# Patient Record
Sex: Male | Born: 1960 | Race: White | Hispanic: No | Marital: Married | Smoking: Former smoker
Health system: Southern US, Community
[De-identification: ages and names within clinical notes are randomized; demographics above are authoritative.]

## PROBLEM LIST (undated history)

## (undated) DIAGNOSIS — N400 Enlarged prostate without lower urinary tract symptoms: Secondary | ICD-10-CM

## (undated) DIAGNOSIS — Z973 Presence of spectacles and contact lenses: Secondary | ICD-10-CM

## (undated) DIAGNOSIS — K5730 Diverticulosis of large intestine without perforation or abscess without bleeding: Secondary | ICD-10-CM

## (undated) DIAGNOSIS — Z860100 Personal history of colon polyps, unspecified: Secondary | ICD-10-CM

## (undated) DIAGNOSIS — K4090 Unilateral inguinal hernia, without obstruction or gangrene, not specified as recurrent: Secondary | ICD-10-CM

## (undated) DIAGNOSIS — R238 Other skin changes: Secondary | ICD-10-CM

## (undated) DIAGNOSIS — R030 Elevated blood-pressure reading, without diagnosis of hypertension: Secondary | ICD-10-CM

## (undated) DIAGNOSIS — G4700 Insomnia, unspecified: Secondary | ICD-10-CM

## (undated) DIAGNOSIS — R399 Unspecified symptoms and signs involving the genitourinary system: Secondary | ICD-10-CM

## (undated) DIAGNOSIS — K8020 Calculus of gallbladder without cholecystitis without obstruction: Secondary | ICD-10-CM

## (undated) DIAGNOSIS — C679 Malignant neoplasm of bladder, unspecified: Secondary | ICD-10-CM

## (undated) HISTORY — PX: HERNIA REPAIR: SHX51

## (undated) HISTORY — PX: TONSILLECTOMY: SUR1361

## (undated) HISTORY — DX: Insomnia, unspecified: G47.00

---

## 2004-08-31 ENCOUNTER — Ambulatory Visit: Payer: Self-pay | Admitting: Internal Medicine

## 2004-09-03 ENCOUNTER — Ambulatory Visit: Payer: Self-pay | Admitting: Internal Medicine

## 2004-09-15 ENCOUNTER — Ambulatory Visit: Payer: Self-pay | Admitting: Internal Medicine

## 2005-04-19 ENCOUNTER — Ambulatory Visit: Payer: Self-pay | Admitting: Internal Medicine

## 2005-10-28 ENCOUNTER — Ambulatory Visit: Payer: Self-pay | Admitting: Internal Medicine

## 2005-10-28 ENCOUNTER — Ambulatory Visit (HOSPITAL_COMMUNITY): Admission: RE | Admit: 2005-10-28 | Discharge: 2005-10-28 | Payer: Self-pay | Admitting: Internal Medicine

## 2005-11-10 ENCOUNTER — Encounter: Payer: Self-pay | Admitting: Internal Medicine

## 2007-05-18 ENCOUNTER — Ambulatory Visit: Payer: Self-pay | Admitting: Internal Medicine

## 2008-02-14 ENCOUNTER — Ambulatory Visit: Payer: Self-pay | Admitting: Internal Medicine

## 2008-02-14 DIAGNOSIS — M5412 Radiculopathy, cervical region: Secondary | ICD-10-CM | POA: Insufficient documentation

## 2008-02-14 DIAGNOSIS — G4700 Insomnia, unspecified: Secondary | ICD-10-CM

## 2008-07-07 ENCOUNTER — Telehealth (INDEPENDENT_AMBULATORY_CARE_PROVIDER_SITE_OTHER): Payer: Self-pay | Admitting: *Deleted

## 2008-07-15 ENCOUNTER — Ambulatory Visit: Payer: Self-pay | Admitting: Internal Medicine

## 2008-07-15 DIAGNOSIS — Z860100 Personal history of colon polyps, unspecified: Secondary | ICD-10-CM | POA: Insufficient documentation

## 2008-07-15 DIAGNOSIS — R05 Cough: Secondary | ICD-10-CM

## 2008-07-21 ENCOUNTER — Ambulatory Visit: Payer: Self-pay | Admitting: Internal Medicine

## 2008-07-21 LAB — CONVERTED CEMR LAB
Bilirubin Urine: NEGATIVE
Blood in Urine, dipstick: NEGATIVE
Glucose, Urine, Semiquant: NEGATIVE
Ketones, urine, test strip: NEGATIVE
Nitrite: NEGATIVE
Protein, U semiquant: NEGATIVE
Specific Gravity, Urine: 1.025
Urobilinogen, UA: 0.2
WBC Urine, dipstick: NEGATIVE
pH: 5

## 2008-07-28 LAB — CONVERTED CEMR LAB
ALT: 24 units/L (ref 0–53)
AST: 19 units/L (ref 0–37)
Basophils Absolute: 0.1 10*3/uL (ref 0.0–0.1)
Basophils Relative: 1.3 % (ref 0.0–3.0)
Bilirubin, Direct: 0.1 mg/dL (ref 0.0–0.3)
CO2: 31 meq/L (ref 19–32)
Calcium: 9.2 mg/dL (ref 8.4–10.5)
Chloride: 107 meq/L (ref 96–112)
Creatinine, Ser: 0.9 mg/dL (ref 0.4–1.5)
Glucose, Bld: 109 mg/dL — ABNORMAL HIGH (ref 70–99)
Hemoglobin: 14.8 g/dL (ref 13.0–17.0)
LDL Cholesterol: 112 mg/dL — ABNORMAL HIGH (ref 0–99)
Lymphocytes Relative: 41.1 % (ref 12.0–46.0)
MCHC: 34.4 g/dL (ref 30.0–36.0)
Monocytes Relative: 8.5 % (ref 3.0–12.0)
Neutro Abs: 2.4 10*3/uL (ref 1.4–7.7)
Neutrophils Relative %: 44.1 % (ref 43.0–77.0)
RBC: 4.55 M/uL (ref 4.22–5.81)
Sodium: 145 meq/L (ref 135–145)
Total Bilirubin: 0.6 mg/dL (ref 0.3–1.2)
Total CHOL/HDL Ratio: 5.1
Total Protein: 7.1 g/dL (ref 6.0–8.3)
VLDL: 15 mg/dL (ref 0–40)

## 2008-11-25 ENCOUNTER — Telehealth: Payer: Self-pay | Admitting: Internal Medicine

## 2009-04-08 ENCOUNTER — Telehealth: Payer: Self-pay | Admitting: Family Medicine

## 2009-04-08 ENCOUNTER — Ambulatory Visit: Payer: Self-pay | Admitting: Family Medicine

## 2009-04-08 LAB — CONVERTED CEMR LAB
Basophils Absolute: 0 10*3/uL (ref 0.0–0.1)
Eosinophils Absolute: 0.1 10*3/uL (ref 0.0–0.7)
HCT: 42.6 % (ref 39.0–52.0)
Hemoglobin: 15.2 g/dL (ref 13.0–17.0)
Lymphs Abs: 1.8 10*3/uL (ref 0.7–4.0)
MCHC: 35.5 g/dL (ref 30.0–36.0)
Monocytes Absolute: 0.7 10*3/uL (ref 0.1–1.0)
Neutro Abs: 7.5 10*3/uL (ref 1.4–7.7)
Platelets: 194 10*3/uL (ref 150.0–400.0)
RDW: 12.5 % (ref 11.5–14.6)

## 2009-08-10 ENCOUNTER — Encounter (INDEPENDENT_AMBULATORY_CARE_PROVIDER_SITE_OTHER): Payer: Self-pay | Admitting: *Deleted

## 2009-11-16 ENCOUNTER — Telehealth: Payer: Self-pay | Admitting: *Deleted

## 2010-03-05 ENCOUNTER — Telehealth (INDEPENDENT_AMBULATORY_CARE_PROVIDER_SITE_OTHER): Payer: Self-pay | Admitting: *Deleted

## 2010-05-07 ENCOUNTER — Telehealth: Payer: Self-pay | Admitting: *Deleted

## 2010-07-28 ENCOUNTER — Ambulatory Visit: Payer: Self-pay | Admitting: Internal Medicine

## 2010-07-28 LAB — CONVERTED CEMR LAB
AST: 17 units/L (ref 0–37)
Albumin: 4.2 g/dL (ref 3.5–5.2)
Alkaline Phosphatase: 72 units/L (ref 39–117)
Basophils Relative: 0.3 % (ref 0.0–3.0)
CO2: 29 meq/L (ref 19–32)
Calcium: 9.5 mg/dL (ref 8.4–10.5)
Eosinophils Absolute: 0.2 10*3/uL (ref 0.0–0.7)
Glucose, Urine, Semiquant: NEGATIVE
HCT: 41.8 % (ref 39.0–52.0)
HDL: 35.3 mg/dL — ABNORMAL LOW (ref >39.00)
Hemoglobin: 14.5 g/dL (ref 13.0–17.0)
Lymphocytes Relative: 15.6 % (ref 12.0–46.0)
MCHC: 34.7 g/dL (ref 30.0–36.0)
Monocytes Relative: 7.8 % (ref 3.0–12.0)
Neutro Abs: 7.8 10*3/uL — ABNORMAL HIGH (ref 1.4–7.7)
Nitrite: NEGATIVE
Potassium: 4.6 meq/L (ref 3.5–5.1)
RBC: 4.42 M/uL (ref 4.22–5.81)
Sodium: 143 meq/L (ref 135–145)
Specific Gravity, Urine: >=1.03
Total CHOL/HDL Ratio: 4
Total Protein: 7.5 g/dL (ref 6.0–8.3)
WBC Urine, dipstick: NEGATIVE

## 2010-08-04 ENCOUNTER — Ambulatory Visit: Payer: Self-pay | Admitting: Internal Medicine

## 2010-08-04 DIAGNOSIS — K824 Cholesterolosis of gallbladder: Secondary | ICD-10-CM | POA: Insufficient documentation

## 2010-08-04 LAB — CONVERTED CEMR LAB
Protein, U semiquant: NEGATIVE
Urobilinogen, UA: 0.2
WBC Urine, dipstick: NEGATIVE

## 2010-09-29 ENCOUNTER — Telehealth: Payer: Self-pay | Admitting: *Deleted

## 2010-11-23 NOTE — Assessment & Plan Note (Signed)
Summary: CPX   Vital Signs:  Patient profile:   50 year old male Height:      72.5 inches Weight:      191 pounds BMI:     25.64 Pulse rate:   72 / minute BP sitting:   110 / 80  (right arm) Cuff size:   regular  Vitals Entered By: Romualdo Bolk, CMA (AAMA) (August 04, 2010 10:41 AM) CC: CPX   History of Present Illness: [name]    comes in today   for check up.   Since last visit  here  there have been no major changes in health status  . His back bothers him a bit at times but   no limiting problems now. No CV  sx sob.  Marland Kitchen  Has some sleep issues and asks for reill of meds to use as  needed.  Has had a uri type illness recently and still an am cough . asks about  chest x ray as ex smoker and fam hx of lung cancer .      Preventive Care Screening  Colonoscopy:    Date:  10/25/2003    Results:  normal   Prior Values:    PSA:  1.70 (07/28/2010)   Preventive Screening-Counseling & Management  Alcohol-Tobacco     Alcohol drinks/day: 3     Alcohol type: wine     Smoking Status: quit  Caffeine-Diet-Exercise     Caffeine use/day: 1-2 cups a day     Does Patient Exercise: yes  Hep-HIV-STD-Contraception     Dental Visit-last 6 months yes     Sun Exposure-Excessive: no  Safety-Violence-Falls     Seat Belt Use: yes     Firearms in the Home: no firearms in the home     Smoke Detectors: yes      Blood Transfusions:  no.    Current Medications (verified): 1)  Ambien 10 Mg Tabs (Zolpidem Tartrate) .... Take 1 Tablet By Mouth At Bedtime 2)  Cyclobenzaprine Hcl 10 Mg  Tabs (Cyclobenzaprine Hcl) .Marland Kitchen.. 1 By Mouth Three Times A Day As Needed Muscle Spasm and Pain 3)  Co-Gesic 5-500 Mg  Tabs (Hydrocodone-Acetaminophen) .Marland Kitchen.. 1-2   By Mouth Q 4-6 Hours   As Needed  For Pain  Allergies (verified): 1)  ! Penicillin  Past History:  Past medical, surgical, family and social histories (including risk factors) reviewed, and no changes noted (except as noted  below).  Past Medical History: Reviewed history from 07/15/2008 and no changes required. see old chart  insomnia intermittent cholesterosis of gallbladder  Colonic polyps, hx of  Past Surgical History: Reviewed history from 07/15/2008 and no changes required. Inguinal herniorrhaphy right Tonsillectomy    Past History:  Care Management: Gastroenterology: Leone Payor  Family History: Reviewed history from 07/15/2008 and no changes required. parent had lung cancer.  moms colon cancer   fathers side see  paper   Social History: Reviewed history from 07/15/2008 and no changes required. Married neborn at home  2 twin  boys and wife  Former Smoker quit about 9 years ago  1ppd x  some milk   hhof 5 Caffeine use/day:  1-2 cups a day Does Patient Exercise:  yes Seat Belt Use:  yes Dental Care w/in 6 mos.:  yes Sun Exposure-Excessive:  no Blood Transfusions:  no  Review of Systems  The patient denies anorexia, fever, weight loss, weight gain, vision loss, decreased hearing, chest pain, syncope, dyspnea on exertion, peripheral edema, headaches, hemoptysis,  abdominal pain, melena, hematochezia, severe indigestion/heartburn, hematuria, incontinence, muscle weakness, suspicious skin lesions, transient blindness, difficulty walking, depression, unusual weight change, abnormal bleeding, enlarged lymph nodes, angioedema, and testicular masses.         ocass back issue  varicose veins   cough for chest cold phlegm in am   but no cp sob or hemoptysis   ruq pain ocassinally   Physical Exam  General:  Well-developed,well-nourished,in no acute distress; alert,appropriate and cooperative throughout examination Head:  normocephalic and atraumatic.   Eyes:  PERRL, EOMs full, conjunctiva clear  Ears:  R ear normal, L ear normal, and no external deformities.   Nose:  no external deformity, no external erythema, and no nasal discharge.   Mouth:  good dentition and pharynx pink and moist.   Neck:   No deformities, masses, or tenderness noted. Chest Wall:  No deformities, masses, tenderness or gynecomastia noted. Lungs:  Normal respiratory effort, chest expands symmetrically. Lungs are clear to auscultation, no crackles or wheezes.no dullness.   Heart:  Normal rate and regular rhythm. S1 and S2 normal without gallop, murmur, click, rub or other extra sounds.no lifts.   Abdomen:  Bowel sounds positive,abdomen soft and non-tender without masses, organomegaly or hernias noted. Rectal:  No external abnormalities noted. Normal sphincter tone. No rectal masses or tenderness. Prostate:  Prostate gland firm and smooth, no enlargement, nodularity, tenderness, mass, asymmetry or induration. Msk:  normal ROM, no joint tenderness, no joint swelling, no joint warmth, no redness over joints, and no joint deformities.   Pulses:  pulses intact without delay   Extremities:  varicose veins no ulcer s   or dema  Neurologic:  alert & oriented X3, cranial nerves II-XII intact, strength normal in all extremities, gait normal, and DTRs symmetrical and normal.   Skin:  turgor normal, color normal, no ecchymoses, no petechiae, and no ulcerations.   Cervical Nodes:  No lymphadenopathy noted Axillary Nodes:  No palpable lymphadenopathy Inguinal Nodes:  No significant adenopathy Psych:  Normal eye contact, appropriate affect. Cognition appears normal.  see labs   Impression & Recommendations:  Problem # 1:  Preventive Health Care (ICD-V70.0) Discussed nutrition,exercise,diet,healthy weight, vitamin D and calcium.     HDL somewhat low  ratio is 4  Reviewed preventive care protocols, scheduled due services, and updated immunizations.  Problem # 2:  COUGH (ICD-786.2) seems uri but with fam hx   lund cancdr and ext tobacco ok to do x ray  Orders: T-2 View CXR (71020TC)  Problem # 3:  INSOMNIA-SLEEP DISORDER-UNSPEC (ICD-780.52)  His updated medication list for this problem includes:    Ambien 10 Mg Tabs  (Zolpidem tartrate) .Marland Kitchen... Take 1 tablet by mouth at bedtime  Discussed sleep hygiene.   Problem # 4:  CHOLESTEROLOSIS OF THE GALLBLADDER (ICD-575.6) only ocass signs  .       chosing to not  have removal.  Complete Medication List: 1)  Ambien 10 Mg Tabs (Zolpidem tartrate) .... Take 1 tablet by mouth at bedtime 2)  Cyclobenzaprine Hcl 10 Mg Tabs (Cyclobenzaprine hcl) .Marland Kitchen.. 1 by mouth three times a day as needed muscle spasm and pain 3)  Co-gesic 5-500 Mg Tabs (Hydrocodone-acetaminophen) .Marland Kitchen.. 1-2   by mouth q 4-6 hours   as needed  for pain  Other Orders: Admin 1st Vaccine (16109) Flu Vaccine 39yrs + (60454) Tdap => 49yrs IM (09811) Admin of Any Addtl Vaccine (91478)  Patient Instructions: 1)  get your colonscopy.  2)  will let you know about  chest x ray result. 3)  mediterranean diet  limit animal fats and sinple carbohydrates and sugars . 4)  exercise  . 5)  check up in a year or as needed. Flu Vaccine Consent Questions     Do you have a history of severe allergic reactions to this vaccine? no    Any prior history of allergic reactions to egg and/or gelatin? no    Do you have a sensitivity to the preservative Thimersol? no    Do you have a past history of Guillan-Barre Syndrome? no    Do you currently have an acute febrile illness? no    Have you ever had a severe reaction to latex? no    Vaccine information given and explained to patient? yes    Are you currently pregnant? no    Lot Number:[id number]   Exp Date:04/23/2011   Site Given  Left Deltoid IM Romualdo Bolk, CMA Duncan Dull)  August 04, 2010 10:47 AM  Prescriptions: AMBIEN 10 MG TABS (ZOLPIDEM TARTRATE) Take 1 tablet by mouth at bedtime  #30 x 1   Entered and Authorized by:   Madelin Headings MD   Signed by:   Madelin Headings MD on 08/04/2010   Method used:   Print then Give to Patient   RxID:   [id number]     .[email or web address]  Laboratory Results   Urine Tests  Date/Time Received: August 04, 2010 10:58  AM   Routine Urinalysis   Color: yellow Appearance: Clear Glucose: negative   (Normal Range: Negative) Bilirubin: negative   (Normal Range: Negative) Ketone: negative   (Normal Range: Negative) Spec. Gravity: 1.015   (Normal Range: 1.003-1.035) Blood: negative   (Normal Range: Negative) pH: 7.0   (Normal Range: 5.0-8.0) Protein: negative   (Normal Range: Negative) Urobilinogen: 0.2   (Normal Range: 0-1) Nitrite: negative   (Normal Range: Negative) Leukocyte Esterace: negative   (Normal Range: Negative)    Comments: Romualdo Bolk, CMA (AAMA)  August 04, 2010 10:59 AM       Immunizations Administered:  Tetanus Vaccine:    Vaccine Type: Tdap    Site: right deltoid    Mfr: GlaxoSmithKline    Dose: 0.5 ml    Route: IM    Given by: Romualdo Bolk, CMA (AAMA)    Exp. Date: 08/12/2012    Lot #: [id number]

## 2010-11-23 NOTE — Progress Notes (Signed)
Summary: refill on ambien   Phone Note Call from Patient Call back at [facility] Phone [phone]   Caller: Patient Summary of Call: Pt needs a refill on ambien called into [facility]. Initial call taken by: Romualdo Bolk, CMA Duncan Dull),  November 16, 2009 12:54 PM  Follow-up for Phone Call        ok to refill x 2  .  needs  OV  before  runs out of refills Follow-up by: Madelin Headings MD,  November 16, 2009 1:23 PM  Additional Follow-up for Phone Call Additional follow up Details #1::        Left message for pt to call back to schedule a follow up appt before refills run out. Rx called in. Additional Follow-up by: Romualdo Bolk, CMA Duncan Dull),  November 16, 2009 3:57 PM    Prescriptions: AMBIEN 10 MG TABS (ZOLPIDEM TARTRATE) Take 1 tablet by mouth at bedtime  #30 x 1   Entered by:   Romualdo Bolk, CMA (AAMA)   Authorized by:   Madelin Headings MD   Signed by:   Romualdo Bolk, CMA (AAMA) on 11/16/2009   Method used:   Telephoned to ...       [facility]* (retail)       [address]       [place], [place]  [id number]       Ph: [phone]       Fax: [phone]   RxID:   [id number]

## 2010-11-23 NOTE — Progress Notes (Signed)
Summary: refill on ambien  Phone Note Call from Patient Call back at [phone]   Caller: Patient Summary of Call: Pt needs a rx on ambien. Pt is going out of town this afternoon. Pt is aware that he needs a rov and will call back to reschedule it once he gets back into town. Initial call taken by: Romualdo Bolk, CMA (AAMA),  May 07, 2010 8:28 AM  Follow-up for Phone Call        ok disp   30  1 by mouth hs as needed sleep  Follow-up by: Madelin Headings MD,  May 07, 2010 12:29 PM  Additional Follow-up for Phone Call Additional follow up Details #1::        Rx called into [facility]. Left message on machine about this.  Additional Follow-up by: Romualdo Bolk, CMA (AAMA),  May 07, 2010 2:15 PM    New/Updated Medications: AMBIEN 10 MG TABS (ZOLPIDEM TARTRATE) Take 1 tablet by mouth at bedtime Prescriptions: AMBIEN 10 MG TABS (ZOLPIDEM TARTRATE) Take 1 tablet by mouth at bedtime  #30 x 0   Entered by:   Romualdo Bolk, CMA (AAMA)   Authorized by:   Madelin Headings MD   Signed by:   Romualdo Bolk, CMA (AAMA) on 05/07/2010   Method used:   Telephoned to ...       [facility]* (retail)       [address]       [place], [place]  [id number]       Ph: [phone]       Fax: [phone]   RxID:   [id number]

## 2010-11-23 NOTE — Progress Notes (Signed)
Summary: refill on ambien  Phone Note Call from Patient   Caller: Patient Summary of Call: Pt needs a refill on his ambien. Initial call taken by: Romualdo Bolk, CMA (AAMA),  September 29, 2010 3:23 PM  Follow-up for Phone Call        ok to refill x 3 Follow-up by: Madelin Headings MD,  September 29, 2010 4:32 PM  Additional Follow-up for Phone Call Additional follow up Details #1::        Rx called in. Pt aware. Additional Follow-up by: Romualdo Bolk, CMA (AAMA),  September 29, 2010 4:47 PM    Prescriptions: AMBIEN 10 MG TABS (ZOLPIDEM TARTRATE) Take 1 tablet by mouth at bedtime  #30 x 2   Entered by:   Romualdo Bolk, CMA (AAMA)   Authorized by:   Madelin Headings MD   Signed by:   Romualdo Bolk, CMA (AAMA) on 09/29/2010   Method used:   Telephoned to ...       [facility]* (retail)       [address]       [place], [place]  [id number]       Ph: [phone]       Fax: [phone]   RxID:   [id number]

## 2010-11-23 NOTE — Progress Notes (Signed)
Summary: Schedule recall colonoscopy   Phone Note Outgoing Call Call back at # [phone]   Call placed by: Christie Nottingham CMA Duncan Dull),  Mar 05, 2010 10:47 AM Call placed to: Patient Summary of Call: Called pt to schedule recall colonoscopy and phone number in chart has been disconnected and there is no alternate numbers.  Initial call taken by: Christie Nottingham CMA Duncan Dull),  Mar 05, 2010 10:48 AM

## 2010-12-14 ENCOUNTER — Other Ambulatory Visit: Payer: Self-pay | Admitting: *Deleted

## 2010-12-14 NOTE — Telephone Encounter (Signed)
Ok to refill x 3 

## 2010-12-14 NOTE — Telephone Encounter (Signed)
Last written on 09/29/10 #30 with 2 refills. Pt states that he has about 1/2 month left but was calling for son's rx and went ahead to let us know that he needs refills as well.

## 2010-12-15 MED ORDER — ZOLPIDEM TARTRATE 10 MG PO TABS: 10.0000 mg | ORAL_TABLET | Freq: Every evening | ORAL | Status: DC | PRN

## 2010-12-15 NOTE — Telephone Encounter (Signed)
Rx called in 

## 2011-04-08 ENCOUNTER — Other Ambulatory Visit: Payer: Self-pay | Admitting: Internal Medicine

## 2011-04-08 NOTE — Telephone Encounter (Signed)
rx sent by fax

## 2011-06-09 ENCOUNTER — Telehealth: Payer: Self-pay | Admitting: *Deleted

## 2011-06-09 NOTE — Telephone Encounter (Signed)
Pt needs a refill ambien and flexeril called into [facility].

## 2011-06-09 NOTE — Telephone Encounter (Signed)
Ok x 1 each 

## 2011-06-13 MED ORDER — CYCLOBENZAPRINE HCL 10 MG PO TABS: 10.0000 mg | ORAL_TABLET | Freq: Three times a day (TID) | ORAL | Status: DC | PRN

## 2011-06-13 MED ORDER — ZOLPIDEM TARTRATE 10 MG PO TABS: 10.0000 mg | ORAL_TABLET | Freq: Every evening | ORAL | Status: DC | PRN

## 2011-06-13 NOTE — Telephone Encounter (Signed)
Rx called in 

## 2011-07-11 ENCOUNTER — Encounter: Payer: Self-pay | Admitting: Internal Medicine

## 2011-07-11 ENCOUNTER — Ambulatory Visit (INDEPENDENT_AMBULATORY_CARE_PROVIDER_SITE_OTHER): Payer: 59 | Admitting: Internal Medicine

## 2011-07-11 VITALS — BP 140/80 | HR 72 | Temp 98.9°F | Resp 16 | Ht 73.0 in | Wt 198.0 lb

## 2011-07-11 DIAGNOSIS — D126 Benign neoplasm of colon, unspecified: Secondary | ICD-10-CM

## 2011-07-11 DIAGNOSIS — Z23 Encounter for immunization: Secondary | ICD-10-CM

## 2011-07-11 DIAGNOSIS — K635 Polyp of colon: Secondary | ICD-10-CM

## 2011-07-11 DIAGNOSIS — L089 Local infection of the skin and subcutaneous tissue, unspecified: Secondary | ICD-10-CM

## 2011-07-11 DIAGNOSIS — B958 Unspecified staphylococcus as the cause of diseases classified elsewhere: Secondary | ICD-10-CM

## 2011-07-11 DIAGNOSIS — G4700 Insomnia, unspecified: Secondary | ICD-10-CM

## 2011-07-11 MED ORDER — DOXYCYCLINE HYCLATE 100 MG PO TABS: 100.0000 mg | ORAL_TABLET | Freq: Two times a day (BID) | ORAL | Status: AC

## 2011-07-11 MED ORDER — ZOLPIDEM TARTRATE 10 MG PO TABS: 10.0000 mg | ORAL_TABLET | Freq: Every evening | ORAL | Status: DC | PRN

## 2011-07-11 NOTE — Progress Notes (Signed)
  Subjective:    Patient ID: [name], male    DOB: 1960/11/17, 50 y.o.   MRN: [id number]  HPI comes in today for an acute visit evaluation. 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 Now he has some spots on his right arm that itch and hurt. It hasn't drained.  One of his children and his wife have had a history of staph infections that are now resolved from the skin. He has no other history of infections or MRSA  Also asks about refill of Ambien. He is using it more regularly but only probably a half a pill just in case. Asks advice in for refill. Review of Systems No associated fever chest pain shortness of breath joint swelling.  Past history family history social history reviewed in the electronic medical record.     Objective:   Physical Exam Well-developed well-nourished in no acute distress affect is normal Neck: Supple without adenopathy or masses or bruits No clubbing cyanosis or edema SKIN: Right forearm shows 3 feeding papular excoriations. These are discrete there is a 2 cm raised red boil indurated nondraining. It is mildly tender and he states it itches  there is a 1 cm boil with a center pustule on the right lateral arm  this was unroofed with a needle and cultured. Minimal discharge  No arm adenopathy  Or streaking  No clubbing cyanosis or edema  PsychOriented x 3. Normal cognition, attention, speech. Not anxious or depressed appearing   Good eye contact .     Assessment & Plan:  Boils probable staph infection by history the larger one may need an I&D if it doesn't continue to get smaller  Culture  and empiric antibiotics local care warm compresses not cold ;close followup.  Sleep disturbance intermittent insomnia. No obvious underlying disease. He is a father of 3.   Risk benefit of medication discussed.  Including dependency and sleep problems with withdrawal. At this point in time we will refill his medication he is fully aware of above.

## 2011-07-11 NOTE — Patient Instructions (Addendum)
This acts like a staph infection . You could have infected bug bites. Warm compresses and topical antibiotic if drains. Will let you know  When culture is back. If getting larger or more tender call  . Sometimes needs to be drained to get bettter.Marland Kitchen

## 2011-07-14 LAB — WOUND CULTURE
Gram Stain: NONE SEEN
Gram Stain: NONE SEEN
Gram Stain: NONE SEEN

## 2011-07-14 NOTE — Progress Notes (Signed)
Left message on machine about results. 

## 2011-07-15 DIAGNOSIS — Z23 Encounter for immunization: Secondary | ICD-10-CM

## 2011-08-03 ENCOUNTER — Other Ambulatory Visit: Payer: Self-pay | Admitting: Internal Medicine

## 2011-08-03 NOTE — Telephone Encounter (Signed)
LOV 07/11/11 NOV- None

## 2011-08-05 ENCOUNTER — Telehealth: Payer: Self-pay | Admitting: *Deleted

## 2011-08-05 MED ORDER — ZOLPIDEM TARTRATE 10 MG PO TABS: 10.0000 mg | ORAL_TABLET | Freq: Every evening | ORAL | Status: DC | PRN

## 2011-08-05 NOTE — Telephone Encounter (Signed)
Refill on zolpidem 10mg  Last filled on 07/11/11

## 2011-08-05 NOTE — Telephone Encounter (Signed)
Addended by: Romualdo Bolk on: 08/05/2011 03:05 PM   Modules accepted: Orders

## 2011-08-05 NOTE — Telephone Encounter (Signed)
Per Dr. Fabian Sharp- Ok x 2

## 2011-09-28 ENCOUNTER — Other Ambulatory Visit: Payer: Self-pay | Admitting: Internal Medicine

## 2011-10-25 DIAGNOSIS — C679 Malignant neoplasm of bladder, unspecified: Secondary | ICD-10-CM

## 2011-10-25 HISTORY — DX: Malignant neoplasm of bladder, unspecified: C67.9

## 2011-10-28 ENCOUNTER — Ambulatory Visit (INDEPENDENT_AMBULATORY_CARE_PROVIDER_SITE_OTHER): Payer: 59 | Admitting: Internal Medicine

## 2011-10-28 ENCOUNTER — Encounter: Payer: Self-pay | Admitting: Internal Medicine

## 2011-10-28 VITALS — BP 120/80 | HR 60 | Ht 72.5 in | Wt 203.0 lb

## 2011-10-28 DIAGNOSIS — E786 Lipoprotein deficiency: Secondary | ICD-10-CM

## 2011-10-28 DIAGNOSIS — G4700 Insomnia, unspecified: Secondary | ICD-10-CM

## 2011-10-28 DIAGNOSIS — K824 Cholesterolosis of gallbladder: Secondary | ICD-10-CM

## 2011-10-28 DIAGNOSIS — R7309 Other abnormal glucose: Secondary | ICD-10-CM

## 2011-10-28 DIAGNOSIS — M545 Low back pain, unspecified: Secondary | ICD-10-CM

## 2011-10-28 DIAGNOSIS — R739 Hyperglycemia, unspecified: Secondary | ICD-10-CM

## 2011-10-28 NOTE — Patient Instructions (Signed)
Continue lifestyle intervention healthy eating and exercise . Plan for  Fasting  Labs to include lipids and blood sugar . Make appt for this .

## 2011-10-28 NOTE — Progress Notes (Signed)
Subjective:    Patient ID: [name], male    DOB: 11-25-1960, 51 y.o.   MRN: [id number]  HPI Comes in today for fu of med/ insomnia issue.  Has been taking  ambien pretty much every noght and does well without se . Aware of  Habit forming  Issue . No tad   Works well .  caffiene in am .      No osa  And no rls.  Last check up last fall and ok  Labs nl except  Had borderline bg and some lipid change. Tries to eat healthy but not so much around holidays.   Problems with back ache at times  Help with flexeril no injury but active job  . No fever weight loss   .  No gu sx  Review of Systems nno cp sob fever rashes bleeding . utd on immuniz and colon.  Past Medical History  Diagnosis Date  . Insomnia cholesterosis    intermittent  . Cholesterosis of gall baldder  . Colon polyps     History   Social History  . Marital Status: Married    Spouse Name: N/A    Number of Children: N/A  . Years of Education: N/A   Occupational History  . Not on file.   Social History Main Topics  . Smoking status: Former Smoker    Quit date: 10/24/2001  . Smokeless tobacco: Not on file  . Alcohol Use: No  . Drug Use: No  . Sexually Active: Not on file   Other Topics Concern  . Not on file   Social History Narrative   hhof 5 Married See centricity  EHRNo currently smoking ex tobacco Carpentry work    Past Surgical History  Procedure Date  . Tonsillectomy   . Hernia repair     right inguinal    Family History  Problem Relation Age of Onset  . Lung cancer Mother   . Colon cancer    . ADD / ADHD Son     Allergies  Allergen Reactions  . Penicillins     Current Outpatient Prescriptions on File Prior to Visit  Medication Sig Dispense Refill  . AMBIEN 10 MG tablet TAKE 1 TABLET AT BEDTIME AS NEEDED FOR SLEEP.  30 each  1  . FLEXERIL 10 MG tablet TAKE ONE TABLET 3 TIMES A DAY AS NEEDED FOR MUSCLE TIGHTNESS OR SPASM.  40 each  0    BP 120/80  Pulse 60  Ht 6' 0.5" (1.842  m)  Wt 203 lb (92.08 kg)  BMI 27.15 kg/m2       Objective:   Physical Exam wdwn in nad HEENT: Normocephalic ;atraumatic , Eyes;  PERRL, EOMs  Full, lids and conjunctiva clear,,Ears: no deformities, canals nl, TM landmarks normal, Nose: no deformity or discharge  Mouth : OP clear without lesion or edema . Neck: Supple without adenopathy or masses or bruits  . Chest:  Clear to A&P without wheezes rales or rhonchi CV:  S1-S2 no gallops or murmurs peripheral perfusion is normal Abdomen:  Sof,t normal bowel sounds without hepatosplenomegaly, no guarding rebound or masses no CVA tenderness No clubbing cyanosis or edema Oriented x 3. Normal cognition, attention, speech. Not anxious or depressed appearing   Good eye contact . Neuro grossly non focal  Lab Results  Component Value Date   CHOL 154 07/28/2010   HDL 35.30* 07/28/2010   LDLCALC 109* 07/28/2010   LDLDIRECT 120.3 07/21/2008   TRIG 50.0  07/28/2010   CHOLHDL 4 07/28/2010        Assessment & Plan:  Insomnia chronic  No alarm features .  Risk benefit of medication discussed. To continue for now with meds .  Hx of elevated FBS  fam hx of dm    lsi and then plan fasting labs   a1c and lipids .  Back ache mechanical episodic  Does good back hygiene may want to see ortho   Disc this   Fu if worse.

## 2011-10-29 ENCOUNTER — Encounter: Payer: Self-pay | Admitting: Internal Medicine

## 2011-10-29 DIAGNOSIS — M545 Low back pain, unspecified: Secondary | ICD-10-CM | POA: Insufficient documentation

## 2011-10-29 DIAGNOSIS — R739 Hyperglycemia, unspecified: Secondary | ICD-10-CM | POA: Insufficient documentation

## 2011-10-29 DIAGNOSIS — E786 Lipoprotein deficiency: Secondary | ICD-10-CM | POA: Insufficient documentation

## 2011-10-29 NOTE — Assessment & Plan Note (Signed)
ocass ruq tenderness with fatty meals   Prefers no intervention at this time .

## 2011-11-01 ENCOUNTER — Other Ambulatory Visit: Payer: Self-pay | Admitting: Internal Medicine

## 2011-11-01 DIAGNOSIS — R739 Hyperglycemia, unspecified: Secondary | ICD-10-CM

## 2011-12-02 ENCOUNTER — Other Ambulatory Visit (INDEPENDENT_AMBULATORY_CARE_PROVIDER_SITE_OTHER): Payer: 59

## 2011-12-02 DIAGNOSIS — R739 Hyperglycemia, unspecified: Secondary | ICD-10-CM

## 2011-12-02 DIAGNOSIS — R7309 Other abnormal glucose: Secondary | ICD-10-CM

## 2011-12-02 LAB — BASIC METABOLIC PANEL
BUN: 15 mg/dL (ref 6–23)
Calcium: 9.4 mg/dL (ref 8.4–10.5)
Creatinine, Ser: 0.8 mg/dL (ref 0.4–1.5)
GFR: 106.63 mL/min (ref >60.00)
Glucose, Bld: 115 mg/dL — ABNORMAL HIGH (ref 70–99)
Potassium: 3.8 mEq/L (ref 3.5–5.1)

## 2011-12-02 LAB — HEMOGLOBIN A1C: Hgb A1c MFr Bld: 5.3 % (ref 4.6–6.5)

## 2011-12-02 LAB — LIPID PANEL
Cholesterol: 176 mg/dL (ref 0–200)
VLDL: 17.8 mg/dL (ref 0.0–40.0)

## 2011-12-08 ENCOUNTER — Encounter: Payer: Self-pay | Admitting: *Deleted

## 2011-12-08 NOTE — Progress Notes (Signed)
Quick Note:    Letter sent to pt.  ______

## 2011-12-22 ENCOUNTER — Telehealth: Payer: Self-pay | Admitting: *Deleted

## 2011-12-22 NOTE — Telephone Encounter (Signed)
Zolpidem 10mg  last filled 11/14/11 #30 Cyclobenzaprine 10mg  last filled 09/29/11 #40

## 2011-12-23 MED ORDER — CYCLOBENZAPRINE HCL 10 MG PO TABS: 10.0000 mg | ORAL_TABLET | Freq: Three times a day (TID) | ORAL | Status: DC | PRN

## 2011-12-23 MED ORDER — ZOLPIDEM TARTRATE 10 MG PO TABS: 10.0000 mg | ORAL_TABLET | Freq: Every evening | ORAL | Status: DC | PRN

## 2011-12-23 NOTE — Telephone Encounter (Signed)
Rx called in and sent electronically

## 2011-12-23 NOTE — Telephone Encounter (Signed)
Ok x 3 on [place] Ok x 2 on the flexeril

## 2011-12-30 ENCOUNTER — Other Ambulatory Visit: Payer: 59

## 2012-01-30 ENCOUNTER — Telehealth: Payer: Self-pay

## 2012-01-30 MED ORDER — CIPROFLOXACIN HCL 500 MG PO TABS: 500.0000 mg | ORAL_TABLET | Freq: Two times a day (BID) | ORAL | Status: AC

## 2012-01-30 NOTE — Telephone Encounter (Signed)
Pt is going to [place] for 5 days and would like Cipro for traveler's diarrhea.    Ok per Dr. Fabian Sharp to give Cipro 500 bid x 3 days.    Pt is aware.

## 2012-02-01 ENCOUNTER — Other Ambulatory Visit: Payer: Self-pay

## 2012-02-01 DIAGNOSIS — Z91030 Bee allergy status: Secondary | ICD-10-CM

## 2012-02-01 MED ORDER — EPINEPHRINE 0.3 MG/0.3ML IJ DEVI: 0.3000 mg | Freq: Once | INTRAMUSCULAR | Status: AC

## 2012-02-01 NOTE — Telephone Encounter (Signed)
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  Pt walked into office to sign paperwork and pt was advised that epi-pen can be given to take with him out of town but pt needs to be seen by an allergist.  Pt is aware.    Referral Ordered.

## 2012-03-09 ENCOUNTER — Other Ambulatory Visit: Payer: Self-pay

## 2012-03-09 MED ORDER — ZOLPIDEM TARTRATE 10 MG PO TABS: 10.0000 mg | ORAL_TABLET | Freq: Every evening | ORAL | Status: DC | PRN

## 2012-03-09 NOTE — Telephone Encounter (Signed)
Pt states he needs a refill of Ambien.  Per Dr. Fabian Sharp pt can have 6 months of Ambien. Rx sent to pharmacy.

## 2012-06-28 ENCOUNTER — Ambulatory Visit (INDEPENDENT_AMBULATORY_CARE_PROVIDER_SITE_OTHER): Payer: 59 | Admitting: Internal Medicine

## 2012-06-28 ENCOUNTER — Encounter: Payer: Self-pay | Admitting: Internal Medicine

## 2012-06-28 VITALS — BP 134/80 | HR 64 | Temp 98.6°F | Wt 194.0 lb

## 2012-06-28 DIAGNOSIS — R109 Unspecified abdominal pain: Secondary | ICD-10-CM

## 2012-06-28 DIAGNOSIS — M549 Dorsalgia, unspecified: Secondary | ICD-10-CM

## 2012-06-28 DIAGNOSIS — R310 Gross hematuria: Secondary | ICD-10-CM

## 2012-06-28 DIAGNOSIS — N390 Urinary tract infection, site not specified: Secondary | ICD-10-CM

## 2012-06-28 LAB — CBC WITH DIFFERENTIAL/PLATELET
Basophils Relative: 0.4 % (ref 0.0–3.0)
Eosinophils Absolute: 0.2 10*3/uL (ref 0.0–0.7)
Eosinophils Relative: 2.8 % (ref 0.0–5.0)
HCT: 44.5 % (ref 39.0–52.0)
Hemoglobin: 14.8 g/dL (ref 13.0–17.0)
Lymphs Abs: 2.2 10*3/uL (ref 0.7–4.0)
MCHC: 33.2 g/dL (ref 30.0–36.0)
MCV: 95.1 fl (ref 78.0–100.0)
Monocytes Absolute: 0.4 10*3/uL (ref 0.1–1.0)
Neutro Abs: 4.5 10*3/uL (ref 1.4–7.7)
RBC: 4.68 Mil/uL (ref 4.22–5.81)
WBC: 7.4 10*3/uL (ref 4.5–10.5)

## 2012-06-28 LAB — T4, FREE: Free T4: 0.86 ng/dL (ref 0.60–1.60)

## 2012-06-28 LAB — BASIC METABOLIC PANEL
CO2: 28 mEq/L (ref 19–32)
Chloride: 105 mEq/L (ref 96–112)
Creatinine, Ser: 0.9 mg/dL (ref 0.4–1.5)
Potassium: 3.9 mEq/L (ref 3.5–5.1)

## 2012-06-28 LAB — HEPATIC FUNCTION PANEL
ALT: 21 U/L (ref 0–53)
Albumin: 4.4 g/dL (ref 3.5–5.2)
Bilirubin, Direct: 0.1 mg/dL (ref 0.0–0.3)
Total Protein: 7.1 g/dL (ref 6.0–8.3)

## 2012-06-28 LAB — POCT URINALYSIS DIPSTICK
Blood, UA: 3
Glucose, UA: NEGATIVE
Spec Grav, UA: 1.02
Urobilinogen, UA: 1

## 2012-06-28 LAB — TSH: TSH: 2.73 u[IU]/mL (ref 0.35–5.50)

## 2012-06-28 MED ORDER — SULFAMETHOXAZOLE-TRIMETHOPRIM 800-160 MG PO TABS: 1.0000 | ORAL_TABLET | Freq: Two times a day (BID) | ORAL | Status: AC

## 2012-06-28 NOTE — Progress Notes (Signed)
  Subjective:    Patient ID: [name], male    DOB: 02-16-1961, 51 y.o.   MRN: [id number]  HPI Patient comes in today for SDA for  new problem evaluation. Onset of   Gross hematuria with clots dark blood brown color 2 days ago  Before onset he had bad suprapubic abd cramp that is better now but a bit sore.   No flank pain but has had  Right back pain off and on for a while  .   NO preceding  Illness resp gi .  No sore throat . n o acute joint changes or rashes. No reg med except  Remus Loffler but has taken almost daily nsaid for aches and pains .  Add hx  Feeling shaky in sides at times  No weight loss neuro sx . No sig etoh caffiene Review of Systems ROS:  GEN/ HEENT: No fever, significant weight changes sweats headaches vision problems hearing changes, CV/ PULM; No chest pain shortness of breath cough, syncope,edema  change in exercise tolerance. GI /GU: No  vomiting, change in bowel habits. No blood in the stools. SKIN/HEME: ,no acute skin rashes suspicious lesions or bleeding. No lymphadenopathy, nodules, masses.  NEURO/ PSYCH:  No neurologic signs such as weakness numbness. No depression anxiety. IMM/ Allergy: No unusual infections.  Allergy .   REST of 12 system review negative except as per HPI Past history family history social history reviewed in the electronic medical record.     Objective:   Physical Exam BP 134/80  Pulse 64  Temp 98.6 F (37 C) (Oral)  Wt 194 lb (87.998 kg)  SpO2 98% wdwn in nad HEENT grossly nl  Chest:  Clear to A&P without wheezes rales or rhonchi CV:  S1-S2 no gallops or murmurs peripheral perfusion is normal Neck: Supple without adenopathy or masses or bruits Abdomen:  Sof,t normal bowel sounds without hepatosplenomegaly, no guarding rebound or masses no CVA tenderness minimal suprapubic area tenderenss No clubbing cyanosis or edema UA 3 + prot heme and leuk+      Assessment & Plan:   Acute new onset gross hematuria  Poss infection but has  proteinuria also. And darker color of urine concern about glomerular poss if infection not confirmed.  Avoid nsaids  Ur cs labs bmp cbc aso c2 c4  Will prob need imaging and referral  .

## 2012-06-28 NOTE — Patient Instructions (Signed)
We will evaluate the cause of the blood in your urine. Infection is possible but may not be the total picture. We'll get lab tests today and urine culture will  need a type of imaging study / specialty evaluation. Urology/ nephrology Would advise against taking Advil Aleve-type products on a regular basis they can damage kidneys over time.

## 2012-06-29 LAB — PROTEIN / CREATININE RATIO, URINE
Protein Creatinine Ratio: 0.95 — ABNORMAL HIGH (ref <0.15)
Total Protein, Urine: 107 mg/dL

## 2012-06-30 DIAGNOSIS — M549 Dorsalgia, unspecified: Secondary | ICD-10-CM | POA: Insufficient documentation

## 2012-06-30 DIAGNOSIS — N390 Urinary tract infection, site not specified: Secondary | ICD-10-CM | POA: Insufficient documentation

## 2012-06-30 LAB — URINE CULTURE
Colony Count: NO GROWTH
Organism ID, Bacteria: NO GROWTH

## 2012-07-03 ENCOUNTER — Other Ambulatory Visit: Payer: Self-pay | Admitting: Family Medicine

## 2012-07-03 DIAGNOSIS — R310 Gross hematuria: Secondary | ICD-10-CM

## 2012-07-05 ENCOUNTER — Other Ambulatory Visit: Payer: 59

## 2012-07-06 ENCOUNTER — Ambulatory Visit (INDEPENDENT_AMBULATORY_CARE_PROVIDER_SITE_OTHER)
Admission: RE | Admit: 2012-07-06 | Discharge: 2012-07-06 | Disposition: A | Discharge status: Home / Self Care | Payer: 59 | Source: Ambulatory Visit | Attending: Internal Medicine | Admitting: Internal Medicine

## 2012-07-06 DIAGNOSIS — R310 Gross hematuria: Secondary | ICD-10-CM

## 2012-07-06 MED ORDER — IOHEXOL 300 MG/ML  SOLN
125.0000 mL | Freq: Once | INTRAMUSCULAR | Status: AC | PRN
  Administered 2012-07-06: 125 mL via INTRAVENOUS

## 2012-07-09 ENCOUNTER — Other Ambulatory Visit: Payer: Self-pay | Admitting: Family Medicine

## 2012-07-09 DIAGNOSIS — R310 Gross hematuria: Secondary | ICD-10-CM

## 2012-08-13 ENCOUNTER — Telehealth: Payer: Self-pay | Admitting: Family Medicine

## 2012-08-13 NOTE — Telephone Encounter (Signed)
Ok to refill  30 # x 3 .. Plan Ov before needs more refills.

## 2012-08-13 NOTE — Telephone Encounter (Signed)
Received a fax from [facility].  Pt needs refills.  Last seen 06/28/12 (acute) and no follow up.  Last filled on 03/09/12 #30 with 5 additional refills.  Please advise.  Thanks!!!

## 2012-08-14 ENCOUNTER — Other Ambulatory Visit: Payer: Self-pay | Admitting: Family Medicine

## 2012-08-14 MED ORDER — ZOLPIDEM TARTRATE 10 MG PO TABS: 10.0000 mg | ORAL_TABLET | Freq: Every evening | ORAL | Status: DC | PRN

## 2012-08-14 NOTE — Telephone Encounter (Signed)
Called and left on voicemail. 

## 2012-09-27 ENCOUNTER — Other Ambulatory Visit: Payer: Self-pay | Admitting: Urology

## 2012-09-28 ENCOUNTER — Encounter (HOSPITAL_BASED_OUTPATIENT_CLINIC_OR_DEPARTMENT_OTHER): Payer: Self-pay | Admitting: *Deleted

## 2012-09-28 ENCOUNTER — Ambulatory Visit (HOSPITAL_COMMUNITY)
Admission: RE | Admit: 2012-09-28 | Discharge: 2012-09-28 | Disposition: A | Discharge status: Home / Self Care | Payer: 59 | Source: Ambulatory Visit | Attending: Urology | Admitting: Urology

## 2012-09-28 DIAGNOSIS — Z01818 Encounter for other preprocedural examination: Secondary | ICD-10-CM | POA: Insufficient documentation

## 2012-09-28 NOTE — Progress Notes (Signed)
NPO AFTER MN. ARRIVES AT 0930. NEEDS HG. PT IS HAVING CXR DONE TODAY.

## 2012-10-03 ENCOUNTER — Ambulatory Visit (HOSPITAL_COMMUNITY): Payer: 59

## 2012-10-03 ENCOUNTER — Encounter (HOSPITAL_BASED_OUTPATIENT_CLINIC_OR_DEPARTMENT_OTHER)
Admission: RE | Disposition: A | Discharge status: Home / Self Care | Payer: Self-pay | Source: Ambulatory Visit | Attending: Urology

## 2012-10-03 ENCOUNTER — Encounter (HOSPITAL_BASED_OUTPATIENT_CLINIC_OR_DEPARTMENT_OTHER): Payer: Self-pay | Admitting: Anesthesiology

## 2012-10-03 ENCOUNTER — Encounter (HOSPITAL_BASED_OUTPATIENT_CLINIC_OR_DEPARTMENT_OTHER): Payer: Self-pay | Admitting: *Deleted

## 2012-10-03 ENCOUNTER — Ambulatory Visit (HOSPITAL_BASED_OUTPATIENT_CLINIC_OR_DEPARTMENT_OTHER): Payer: 59 | Admitting: Anesthesiology

## 2012-10-03 ENCOUNTER — Ambulatory Visit (HOSPITAL_BASED_OUTPATIENT_CLINIC_OR_DEPARTMENT_OTHER)
Admission: RE | Admit: 2012-10-03 | Discharge: 2012-10-03 | Disposition: A | Discharge status: Home / Self Care | Payer: 59 | Source: Ambulatory Visit | Attending: Urology | Admitting: Urology

## 2012-10-03 DIAGNOSIS — Z87891 Personal history of nicotine dependence: Secondary | ICD-10-CM | POA: Insufficient documentation

## 2012-10-03 DIAGNOSIS — C672 Malignant neoplasm of lateral wall of bladder: Secondary | ICD-10-CM | POA: Insufficient documentation

## 2012-10-03 HISTORY — DX: Malignant neoplasm of bladder, unspecified: C67.9

## 2012-10-03 HISTORY — PX: CYSTOSCOPY WITH RETROGRADE PYELOGRAM, URETEROSCOPY AND STENT PLACEMENT: SHX5789

## 2012-10-03 HISTORY — PX: TRANSURETHRAL RESECTION OF BLADDER TUMOR: SHX2575

## 2012-10-03 SURGERY — TURBT (TRANSURETHRAL RESECTION OF BLADDER TUMOR)
Anesthesia: General | Site: Ureter | Wound class: Clean Contaminated

## 2012-10-03 MED ORDER — TRAMADOL HCL 50 MG PO TABS
100.0000 mg | ORAL_TABLET | Freq: Four times a day (QID) | ORAL | Status: AC | PRN
  Administered 2012-10-03: 100 mg via ORAL
  Filled 2012-10-03: qty 2

## 2012-10-03 MED ORDER — IOHEXOL 300 MG/ML  SOLN
INTRAMUSCULAR | Status: DC | PRN
  Administered 2012-10-03: 5 mL

## 2012-10-03 MED ORDER — ONDANSETRON HCL 4 MG/2ML IJ SOLN
INTRAMUSCULAR | Status: DC | PRN
  Administered 2012-10-03: 4 mg via INTRAVENOUS

## 2012-10-03 MED ORDER — FENTANYL CITRATE 0.05 MG/ML IJ SOLN
INTRAMUSCULAR | Status: DC | PRN
  Administered 2012-10-03 (×2): 25 ug via INTRAVENOUS
  Administered 2012-10-03: 50 ug via INTRAVENOUS
  Administered 2012-10-03 (×2): 25 ug via INTRAVENOUS
  Administered 2012-10-03: 50 ug via INTRAVENOUS

## 2012-10-03 MED ORDER — CIPROFLOXACIN IN D5W 400 MG/200ML IV SOLN
400.0000 mg | INTRAVENOUS | Status: AC
  Administered 2012-10-03: 400 mg via INTRAVENOUS
  Filled 2012-10-03: qty 200

## 2012-10-03 MED ORDER — PROPOFOL 10 MG/ML IV BOLUS
INTRAVENOUS | Status: DC | PRN
  Administered 2012-10-03: 40 mg via INTRAVENOUS
  Administered 2012-10-03: 240 mg via INTRAVENOUS

## 2012-10-03 MED ORDER — LIDOCAINE HCL (CARDIAC) 20 MG/ML IV SOLN
INTRAVENOUS | Status: DC | PRN
  Administered 2012-10-03: 85 mg via INTRAVENOUS

## 2012-10-03 MED ORDER — SODIUM CHLORIDE 0.9 % IR SOLN
Status: DC | PRN
  Administered 2012-10-03: 6000 mL via INTRAVESICAL

## 2012-10-03 MED ORDER — FENTANYL CITRATE 0.05 MG/ML IJ SOLN
25.0000 ug | INTRAMUSCULAR | Status: DC | PRN
  Filled 2012-10-03: qty 1

## 2012-10-03 MED ORDER — DEXAMETHASONE SODIUM PHOSPHATE 4 MG/ML IJ SOLN
INTRAMUSCULAR | Status: DC | PRN
  Administered 2012-10-03: 10 mg via INTRAVENOUS

## 2012-10-03 MED ORDER — TAMSULOSIN HCL 0.4 MG PO CAPS
0.4000 mg | ORAL_CAPSULE | Freq: Every day | ORAL | Status: AC
  Administered 2012-10-03: 0.4 mg via ORAL
  Filled 2012-10-03: qty 1

## 2012-10-03 MED ORDER — TRAMADOL HCL 50 MG PO TABS: 50.0000 mg | ORAL_TABLET | Freq: Four times a day (QID) | ORAL | Status: DC | PRN

## 2012-10-03 MED ORDER — BELLADONNA ALKALOIDS-OPIUM 16.2-60 MG RE SUPP
RECTAL | Status: DC | PRN
  Administered 2012-10-03: 1 via RECTAL

## 2012-10-03 MED ORDER — LACTATED RINGERS IV SOLN
INTRAVENOUS | Status: DC
  Filled 2012-10-03: qty 1000

## 2012-10-03 MED ORDER — TAMSULOSIN HCL 0.4 MG PO CAPS: 0.4000 mg | ORAL_CAPSULE | Freq: Every day | ORAL | Status: DC

## 2012-10-03 MED ORDER — LACTATED RINGERS IV SOLN
INTRAVENOUS | Status: DC
  Administered 2012-10-03: 10:00:00 via INTRAVENOUS
  Administered 2012-10-03: 100 mL/h via INTRAVENOUS
  Filled 2012-10-03: qty 1000

## 2012-10-03 MED ORDER — SENNA-DOCUSATE SODIUM 8.6-50 MG PO TABS: 1.0000 | ORAL_TABLET | Freq: Two times a day (BID) | ORAL | Status: DC

## 2012-10-03 SURGICAL SUPPLY — 38 items
BAG URINE DRAINAGE (UROLOGICAL SUPPLIES) IMPLANT
BAG URINE LEG 19OZ MD ST LTX (BAG) IMPLANT
BAG URINE LEG 500ML (DRAIN) IMPLANT
BAG URO CATCHER STRL LF (DRAPE) ×3 IMPLANT
BASKET LASER NITINOL 1.9FR (BASKET) ×3 IMPLANT
BASKET ZERO TIP NITINOL 2.4FR (BASKET) IMPLANT
CATH FOLEY 2WAY  3CC  8FR (CATHETERS)
CATH FOLEY 2WAY 3CC 8FR (CATHETERS) IMPLANT
CATH FOLEY 2WAY SLVR  5CC 22FR (CATHETERS)
CATH FOLEY 2WAY SLVR 30CC 20FR (CATHETERS) IMPLANT
CATH FOLEY 2WAY SLVR 5CC 22FR (CATHETERS) IMPLANT
CATH INTERMIT  6FR 70CM (CATHETERS) IMPLANT
CLOTH BEACON ORANGE TIMEOUT ST (SAFETY) ×3 IMPLANT
DRAPE CAMERA CLOSED 9X96 (DRAPES) ×3 IMPLANT
ELECT LOOP MED HF 24F 12D CBL (CLIP) ×3 IMPLANT
ELECT REM PT RETURN 9FT ADLT (ELECTROSURGICAL) ×3
ELECTRODE REM PT RTRN 9FT ADLT (ELECTROSURGICAL) ×2 IMPLANT
EVACUATOR MICROVAS BLADDER (UROLOGICAL SUPPLIES) ×3 IMPLANT
GLOVE BIO SURGEON STRL SZ 6.5 (GLOVE) ×3 IMPLANT
GLOVE BIO SURGEON STRL SZ7 (GLOVE) ×3 IMPLANT
GLOVE BIO SURGEON STRL SZ7.5 (GLOVE) ×3 IMPLANT
GLOVE INDICATOR 7.0 STRL GRN (GLOVE) ×3 IMPLANT
GOWN PREVENTION PLUS XLARGE (GOWN DISPOSABLE) ×3 IMPLANT
GOWN STRL NON-REIN LRG LVL3 (GOWN DISPOSABLE) ×6 IMPLANT
GOWN SURGICAL LARGE (GOWNS) ×6 IMPLANT
GUIDEWIRE ANG ZIPWIRE 038X150 (WIRE) ×3 IMPLANT
GUIDEWIRE STR DUAL SENSOR (WIRE) IMPLANT
IV NS IRRIG 3000ML ARTHROMATIC (IV SOLUTION) ×9 IMPLANT
KIT ASPIRATION TUBING (SET/KITS/TRAYS/PACK) IMPLANT
LOOP CUTTING 24FR OLYMPUS (CUTTING LOOP) IMPLANT
LOOP CUTTING 26FR OLYMPUS (CUTTING LOOP) IMPLANT
PACK CYSTOSCOPY (CUSTOM PROCEDURE TRAY) ×3 IMPLANT
SET ASPIRATION TUBING (TUBING) IMPLANT
STENT CONTOUR 6FRX26X.038 (STENTS) IMPLANT
STENT URET 6FRX26 CONTOUR (STENTS) ×3 IMPLANT
SYRINGE 10CC LL (SYRINGE) ×3 IMPLANT
SYRINGE IRR TOOMEY STRL 70CC (SYRINGE) ×3 IMPLANT
TUBE FEEDING 8FR 16IN STR KANG (MISCELLANEOUS) IMPLANT

## 2012-10-03 NOTE — Anesthesia Postprocedure Evaluation (Signed)
  Anesthesia Post-op Note  Patient: [name]  Procedure(s) Performed: Procedure(s) (LRB): TRANSURETHRAL RESECTION OF BLADDER TUMOR (TURBT) (N/A) CYSTOSCOPY WITH RETROGRADE PYELOGRAM, URETEROSCOPY AND STENT PLACEMENT (Bilateral)  Patient Location: PACU  Anesthesia Type: General  Level of Consciousness: awake and alert   Airway and Oxygen Therapy: Patient Spontanous Breathing  Post-op Pain: mild  Post-op Assessment: Post-op Vital signs reviewed, Patient's Cardiovascular Status Stable, Respiratory Function Stable, Patent Airway and No signs of Nausea or vomiting  Last Vitals:  Filed Vitals:   10/03/12 1245  BP: 134/99  Pulse: 78  Temp:   Resp: 14    Post-op Vital Signs: stable   Complications: No apparent anesthesia complications

## 2012-10-03 NOTE — Anesthesia Procedure Notes (Signed)
Procedure Name: LMA Insertion Date/Time: 10/03/2012 11:34 AM Performed by: Fran Lowes Pre-anesthesia Checklist: Patient identified, Emergency Drugs available, Suction available and Patient being monitored Patient Re-evaluated:Patient Re-evaluated prior to inductionOxygen Delivery Method: Circle System Utilized Preoxygenation: Pre-oxygenation with 100% oxygen Intubation Type: IV induction Ventilation: Mask ventilation without difficulty LMA: LMA inserted LMA Size: 4.0 Number of attempts: 1 Airway Equipment and Method: bite block Placement Confirmation: positive ETCO2 Tube secured with: Tape Dental Injury: Teeth and Oropharynx as per pre-operative assessment

## 2012-10-03 NOTE — Brief Op Note (Signed)
10/03/2012  12:22 PM  PATIENT:  [name]  51 y.o. male  PRE-OPERATIVE DIAGNOSIS:  Bladder Cancer  POST-OPERATIVE DIAGNOSIS:  Bladder Cancer  PROCEDURE:  Procedure(s) (LRB) with comments: TRANSURETHRAL RESECTION OF BLADDER TUMOR (TURBT) (N/A) CYSTOSCOPY WITH RETROGRADE PYELOGRAM, URETEROSCOPY AND STENT PLACEMENT (Bilateral) - POSSIBLE LEFT STENT PLACEMENT  SURGEON:  Surgeon(s) and Role:    * Sebastian Ache, MD - Primary  PHYSICIAN ASSISTANT:   ASSISTANTS: none   ANESTHESIA:   general  EBL:  Total I/O In: 200 [I.V.:200] Out: -   BLOOD ADMINISTERED:none  DRAINS: none   LOCAL MEDICATIONS USED:  NONE  SPECIMEN:  Source of Specimen:  1 - Bladder Tumor, 2 - Base of Bladder Tumor  DISPOSITION OF SPECIMEN:  PATHOLOGY  COUNTS:  YES  TOURNIQUET:  * No tourniquets in log *  DICTATION: .Other Dictation: Dictation Number [id number]  PLAN OF CARE: Discharge to home after PACU  PATIENT DISPOSITION:  PACU - hemodynamically stable.   Delay start of Pharmacological VTE agent (>24hrs) due to surgical blood loss or risk of bleeding: no

## 2012-10-03 NOTE — Transfer of Care (Signed)
Immediate Anesthesia Transfer of Care Note  Patient: [name]  Procedure(s) Performed: Procedure(s) (LRB): TRANSURETHRAL RESECTION OF BLADDER TUMOR (TURBT) (N/A) CYSTOSCOPY WITH RETROGRADE PYELOGRAM, URETEROSCOPY AND STENT PLACEMENT (Bilateral)  Patient Location: PACU  Anesthesia Type: General  Level of Consciousness: awake, oriented, sedated and patient cooperative  Airway & Oxygen Therapy: Patient Spontanous Breathing and Patient connected to face mask oxygen  Post-op Assessment: Report given to PACU RN and Post -op Vital signs reviewed and stable  Post vital signs: Reviewed and stable  Complications: No apparent anesthesia complications

## 2012-10-03 NOTE — Anesthesia Preprocedure Evaluation (Signed)
Anesthesia Evaluation  Patient identified by MRN, date of birth, ID band Patient awake    Reviewed: Allergy & Precautions, H&P , NPO status , Patient's Chart, lab work & pertinent test results  Airway Mallampati: II TM Distance: >3 FB Neck ROM: full    Dental No notable dental hx. (+) Teeth Intact and Dental Advisory Given Crown broken in back:   Pulmonary neg pulmonary ROS,  breath sounds clear to auscultation  Pulmonary exam normal       Cardiovascular Exercise Tolerance: Good negative cardio ROS  Rhythm:regular Rate:Normal     Neuro/Psych negative neurological ROS  negative psych ROS   GI/Hepatic negative GI ROS, Neg liver ROS,   Endo/Other  negative endocrine ROS  Renal/GU negative Renal ROS  negative genitourinary   Musculoskeletal   Abdominal   Peds  Hematology negative hematology ROS (+)   Anesthesia Other Findings   Reproductive/Obstetrics negative OB ROS                           Anesthesia Physical Anesthesia Plan  ASA: I  Anesthesia Plan: General   Post-op Pain Management:    Induction: Intravenous  Airway Management Planned: LMA  Additional Equipment:   Intra-op Plan:   Post-operative Plan:   Informed Consent: I have reviewed the patients History and Physical, chart, labs and discussed the procedure including the risks, benefits and alternatives for the proposed anesthesia with the patient or authorized representative who has indicated his/her understanding and acceptance.   Dental Advisory Given  Plan Discussed with: CRNA and Surgeon  Anesthesia Plan Comments:         Anesthesia Quick Evaluation

## 2012-10-03 NOTE — H&P (Signed)
[name] is an 51 y.o. male.   Chief Complaint: Pre-Op Transurethral Resection of Bladder Tumor HPI:  1 - Bladder Cancer -  Pt with gross hematuria with clots first noted 06/2012. Remote 10PY smoker. No dye / chemical exposure. CT Urogram at that time with marked left bladder thickening / assymetry. No upper tract problems. Office cysto 09/25/2012 with multifocal left wall papillary tumor near ureteral orifice.  PMH sig for inguinal hernia repair as a child. No CV diseas. No blood thinners.   Today "[name]" is seen for TURBT for diagnostic and therapeutic purposes for his bladder cancer.   Past Medical History  Diagnosis Date  . Insomnia     intermittent  . Colon polyps   . Bladder cancer   . Gross hematuria   . Gallbladder cholesterolosis intermittant  . Frequency of urination   . Urgency of urination   . Lower abdominal pain bladder area    Past Surgical History  Procedure Date  . Tonsillectomy as child  . Hernia repair as child    right inguinal    Family History  Problem Relation Age of Onset  . Lung cancer Mother   . Colon cancer    . ADD / ADHD Son    Social History:  reports that he quit smoking about 10 years ago. His smoking use included Cigarettes. He has a 10 pack-year smoking history. He has never used smokeless tobacco. He reports that he drinks about one ounce of alcohol per week. He reports that he does not use illicit drugs.  Allergies:  Allergies  Allergen Reactions  . Bee Venom Anaphylaxis  . Penicillins Other (See Comments)    unknown    No prescriptions prior to admission    No results found for this or any previous visit (from the past 48 hour(s)). No results found.  Review of Systems  Constitutional: Negative.  Negative for fever and chills.  HENT: Negative.   Eyes: Negative.   Respiratory: Negative.   Cardiovascular: Negative.   Gastrointestinal: Negative.   Genitourinary: Positive for dysuria and hematuria. Negative for flank  pain.  Musculoskeletal: Negative.   Skin: Negative.   Neurological: Negative.   Endo/Heme/Allergies: Negative.   Psychiatric/Behavioral: Negative.     Height 6' (1.829 m), weight 86.183 kg (190 lb). Physical Exam  Constitutional: He appears well-developed and well-nourished.  HENT:  Head: Normocephalic and atraumatic.  Eyes: EOM are normal. Pupils are equal, round, and reactive to light.  Neck: Normal range of motion. Neck supple.  Cardiovascular: Normal rate.   Respiratory: Effort normal.  GI: Soft. Bowel sounds are normal.  Genitourinary: Penis normal.  Musculoskeletal: Normal range of motion.  Neurological: He is alert.  Skin: Skin is warm and dry.  Psychiatric: He has a normal mood and affect. His behavior is normal. Judgment and thought content normal.     Assessment/Plan 1 - Bladder Cancer - Needs tissue diagnosis for staging and initial therapy.  We re-discussed operative biopsy / transurethral resection as the best next step for diagnostic and therapeutic purposes with goals being to remove all visible cancer and obtain tissue for pathologic exam. We discussed that for some low-grade tumors, this may be all the treatment required, but that for many other tumors such as high-grade lesions, further therapy including surgery and or chemotherapy may be warranted. We also outlined the fact that any bladder cancer diagnosis will require close follow-up with periodic upper and lower tract evaluation. We discussed risks including bleeding, infection, damage  to kidney / ureter / bladder including bladder perforation which can typically managed with prolonged foley catheterization. We mentioned anesthetic and other rare risks including DVT, PE, MI, and mortality. I also mentioned that adjunctive procedures such as ureteral stenting, retrograde pyelography, and ureteroscopy may be necessary to fully evaluate the urinary tract depending on intra-operative findings. After answering all  questions to the patient's satisfaction, they wish to proceed.   May need left ureteral stent given close proximity to UO, additional risks of ureteral stricture discussed.   Kinze Labo 10/03/2012, 6:44 AM

## 2012-10-04 ENCOUNTER — Encounter (HOSPITAL_BASED_OUTPATIENT_CLINIC_OR_DEPARTMENT_OTHER): Payer: Self-pay | Admitting: Urology

## 2012-10-05 NOTE — Op Note (Signed)
[name], [name]                ACCOUNT NO.:  [id number]  MEDICAL RECORD NO.:  [id number]  LOCATION:                                 FACILITY:  PHYSICIAN:  Sebastian Ache, MD     DATE OF BIRTH:  10-28-1960  DATE OF PROCEDURE:  10/03/2012 DATE OF DISCHARGE:                              OPERATIVE REPORT   DIAGNOSIS:  Bladder cancer.  PROCEDURE: 1. Transurethral resection of bladder tumor, volume large. 2. Left retrograde pyelogram interpretation. 3. Insertion of left ureteral stent 6 x 26, no tether.  FINDINGS: 1. Large multifocal papillary bladder tumor involving approximately     1/4th circumference of the bladder mostly in the left wall     encroaching very close within 1 cm of the left ureteral orifice. 2. Unremarkable left retrograde pyelogram.  SPECIMEN: 1. Bladder tumor. 2. Base of bladder tumor.  ESTIMATED BLOOD LOSS:  Nil.  COMPLICATIONS:  None.  INDICATION:  [name] is a pleasant 51 year old gentleman, who on workup for gross hematuria was found to have multifocal papillary appearing bladder tumor.  CT scan revealed no evidence of obvious locally advanced distant disease.  Options were discussed for management including endoscopic examination with transurethral resection of bladder tumor and possible left ureteral stenting given the location close to the ureteral orifice.  The patient wished to proceed.  Informed consent was obtained and placed in medical record.  PROCEDURE IN DETAIL:  The patient being [employer], was verified. Procedure being transurethral resection of bladder tumor with left ureteral stenting was confirmed.  Procedure was carried out.  A time-out was performed.  Intravenous antibiotics administered.  General LMA anesthesia was introduced.  The patient was placed into a low lithotomy position.  Sterile field was created by prepping and draping the patient's penis, perineum, and proximal thighs using iodine x3.   Next, cystourethroscopy was performed using a 22-French rigid cystoscope with 30-degrees lens.  Inspection of the anterior and posterior urethra unremarkable.  Inspection of urinary bladder revealed multifocal papillary tumor involving again approximately 1/4 circumference of the bladder mostly on the left wall.  This came within 1 cm of the left ureteral orifice.  Next, the left ureteral orifice was gently cannulated using a 6-French end-hole catheter and left retrograde pyelogram was obtained.  Left retrograde pyelogram demonstrated a single left ureter, single system left kidney.  There were no filling defects or narrowing.  A 0.038 Sensor wire was advanced at the level of the upper pole and the end-hole catheter was advanced to this location and set aside to protect ureteral orifice during the resection portion of the procedure.  The cystoscope was then exchanged for the 26-French ACMI gyrus type continuous flow resectoscope using normal saline irrigation.  Systematic resection was performed of all papillary tumor, taking great care to avoid bladder perforation which did not occur.  Additional fulguration was performed of the erythematous appearing edges.  Resection came to within 1 cm of the ureteral orifice, however, the ureteral orifice itself was not directly resected.  Total surface area of tumor was approximately 7 cm, although the majority of this was very flat appearing.  Bladder tumor representative fragments were set aside  for permanent pathology in light of labeled bladder tumor.  Separate base biopsy was performed using cold cup forceps of what appeared to be the seromuscular layer directly beneath the previous tumor.  These were set aside and labeled base of bladder tumor.  Additional hemostasis was achieved with point coagulation with the resectoscope loop.  Given the close proximity of resection to the ureteral orifice, it was felt that ureteral stenting was  warranted.  As such, the Sensor wire was once again advanced to the level of the upper pole and the end-hole catheter was exchanged for a new 6 x 26 double-J stent using cystoscopic and fluoroscopic guidance.  Good proximal and distal curl were noted. Efflux of urine was seen around and through the distal end of the stent. Final inspection of the urinary bladder again revealed no perforation. Complete ablation and resection of all visible tumor and excellent hemostasis.  Bladder was emptied per cystoscope.  Procedure was then terminated.  The patient tolerated the procedure well.  There were no immediate periprocedural complications.  The patient was taken to postanesthesia care unit in stable condition.          ______________________________ Sebastian Ache, MD     TM/MEDQ  D:  10/03/2012  T:  10/04/2012  Job:  [id number]

## 2012-10-24 HISTORY — PX: COLONOSCOPY: SHX174

## 2012-10-25 ENCOUNTER — Telehealth: Payer: Self-pay | Admitting: Family Medicine

## 2012-10-25 NOTE — Telephone Encounter (Signed)
The pt is requesting a refill.  Last seen on 06/28/12 and has no future appt.  Last filled on 08/14/12.  Please advise.  Thanks!!

## 2012-10-26 ENCOUNTER — Other Ambulatory Visit: Payer: Self-pay | Admitting: Family Medicine

## 2012-10-26 MED ORDER — ZOLPIDEM TARTRATE 10 MG PO TABS: 10.0000 mg | ORAL_TABLET | Freq: Every evening | ORAL | Status: DC | PRN

## 2012-10-26 NOTE — Telephone Encounter (Signed)
Ok to refill x   3 months  OV before runs out of refills.

## 2012-10-26 NOTE — Telephone Encounter (Signed)
Left message on pt's home/cell informing him that medication has been called in.

## 2012-11-20 ENCOUNTER — Ambulatory Visit: Payer: 59 | Admitting: Internal Medicine

## 2012-11-20 ENCOUNTER — Encounter: Payer: Self-pay | Admitting: Internal Medicine

## 2012-11-20 ENCOUNTER — Ambulatory Visit (INDEPENDENT_AMBULATORY_CARE_PROVIDER_SITE_OTHER): Payer: 59 | Admitting: Internal Medicine

## 2012-11-20 VITALS — BP 130/84 | HR 76 | Temp 97.9°F | Wt 195.0 lb

## 2012-11-20 DIAGNOSIS — C679 Malignant neoplasm of bladder, unspecified: Secondary | ICD-10-CM

## 2012-11-20 DIAGNOSIS — R7309 Other abnormal glucose: Secondary | ICD-10-CM

## 2012-11-20 DIAGNOSIS — R739 Hyperglycemia, unspecified: Secondary | ICD-10-CM

## 2012-11-20 DIAGNOSIS — G4700 Insomnia, unspecified: Secondary | ICD-10-CM

## 2012-11-20 DIAGNOSIS — E786 Lipoprotein deficiency: Secondary | ICD-10-CM

## 2012-11-20 LAB — LIPID PANEL
HDL: 39.1 mg/dL (ref >39.00)
LDL Cholesterol: 107 mg/dL — ABNORMAL HIGH (ref 0–99)
Total CHOL/HDL Ratio: 4
Triglycerides: 67 mg/dL (ref 0.0–149.0)
VLDL: 13.4 mg/dL (ref 0.0–40.0)

## 2012-11-20 LAB — HEMOGLOBIN A1C: Hgb A1c MFr Bld: 5.1 % (ref 4.6–6.5)

## 2012-11-20 NOTE — Patient Instructions (Signed)
Ok to continue same medication. Will notify you  of labs when available. rov in 6 months  Or as neede

## 2012-11-20 NOTE — Progress Notes (Signed)
Chief Complaint  Patient presents with  . Follow-up    HPI: Patient comes in today for follow up of  multiple medical problems.  Still taking ambien every night   Sleeps. Well 6 am .    No hangover.   rough sleep if doesn't take this.    Tends to avoid alcohol as it can make him feel a little bit down. Otherwise denies significant depression or hopelessness. He is recently undergone surgery treatment for bladder cancer and states she's doing pretty well being followed up every 6 months.  No cardiovascular pulmonary symptoms states that his neck sometimes is  a bit stiff wonders if he's getting arthritis Some exercise  caffiene 1 -2 per day.  etoh 1-2 every few week. s ROS: See pertinent positives and negatives per HPI. No unusual bleeding nausea vomiting or gallbladder acting up  Past Medical History  Diagnosis Date  . Insomnia     intermittent  . Colon polyps   . Bladder cancer   . Gross hematuria   . Gallbladder cholesterolosis intermittant  . Frequency of urination   . Urgency of urination   . Lower abdominal pain bladder area    Family History  Problem Relation Age of Onset  . Lung cancer Mother   . Colon cancer      fathers side   . ADD / ADHD Son     History   Social History  . Marital Status: Married    Spouse Name: N/A    Number of Children: N/A  . Years of Education: N/A   Social History Main Topics  . Smoking status: Former Smoker -- 1.0 packs/day for 10 years    Types: Cigarettes    Quit date: 10/24/2001  . Smokeless tobacco: Never Used  . Alcohol Use: 1.0 oz/week    2 drink(s) per week  . Drug Use: No  . Sexually Active: None   Other Topics Concern  . None   Social History Narrative   hhof 5 Married See centricity  EHRNo currently smoking.[employer] ex tobacco Carpentry work    Outpatient Encounter Prescriptions as of 11/20/2012  Medication Sig Dispense Refill  . zolpidem (AMBIEN) 10 MG tablet Take 1 tablet (10 mg total) by mouth at bedtime as needed for  sleep.  30 tablet  2  . EPINEPHrine (EPIPEN) 0.3 mg/0.3 mL DEVI Inject 0.3 mLs (0.3 mg total) into the muscle once.  1 Device  0  . [DISCONTINUED] cyclobenzaprine (FLEXERIL) 10 MG tablet Take 1 tablet (10 mg total) by mouth 3 (three) times daily as needed for muscle spasms.  40 tablet  1  . [DISCONTINUED] sennosides-docusate sodium (SENOKOT-S) 8.6-50 MG tablet Take 1 tablet by mouth 2 (two) times daily. While taking pain meds to prevent constipation  30 tablet  0  . [DISCONTINUED] Tamsulosin HCl (FLOMAX) 0.4 MG CAPS Take 1 capsule (0.4 mg total) by mouth daily. As needed for stent discomfort  30 capsule  1  . [DISCONTINUED] traMADol (ULTRAM) 50 MG tablet Take 1-2 tablets (50-100 mg total) by mouth every 6 (six) hours as needed for pain.  30 tablet  1  . [DISCONTINUED] zolpidem (AMBIEN) 10 MG tablet Take 1 tablet (10 mg total) by mouth at bedtime as needed for Sleep.  30 tablet  2    EXAM:  BP 130/84  Pulse 76  Temp 97.9 F (36.6 C) (Oral)  Wt 195 lb (88.451 kg)  SpO2 98%  There is no height on file to calculate BMI.  GENERAL: vitals reviewed and listed above, alert, oriented, appears well hydrated and in no acute distress he is here with his son for his son's visit also  HEENT: atraumatic, conjunctiva  clear, no obvious abnormalities on inspection of external nose and ears OP : no lesion edema or exudate TMs are intact tongue is midline  NECK: no obvious masses on inspection palpation no bruits or adenopathy no midline tenderness.  LUNGS: clear to auscultation bilaterally, no wheezes, rales or rhonchi, good air movement  CV: HRRR, no clubbing cyanosis or  peripheral edema nl cap refill  Abdomen soft without organomegaly guarding or rebound negative flank pain MS: moves all extremities without noticeable focal  abnormality nonfocal.  PSYCH: pleasant and cooperative, no obvious depression or anxiety  ASSESSMENT AND PLAN:  Discussed the following assessment and plan:  1.  Hyperglycemia  Lipid panel, Hemoglobin A1c   Due for lab check see past notes  2. INSOMNIA-SLEEP DISORDER-UNSPEC     Risk-benefit medication discussed patient reports no hangover effect discussed use of medicine and dependency. Currently will continue  3. Low HDL (under 40)  Lipid panel, Hemoglobin A1c   Recheck labs today  4. Bladder cancer     Status post treatment doing well   Okay to continue on the Ambien and recheck in 6 months. We'll need to review preventive parameters at that time. After patient left noted that colonoscopy was not in the electronic record however I had thought he had had one of these.  Has f dx of polyps  Uncertain when he is due . Laboratory monitoring ordered today. -Patient advised to return or notify health care team  if symptoms worsen or persist or new concerns arise.  Patient Instructions  Ok to continue same medication. Will notify you  of labs when available. rov in 6 months  Or as neede    Burna Mortimer K. Celese Banner M.D.

## 2012-11-23 NOTE — Progress Notes (Signed)
Quick Note:  Called and left a message for return call. ______

## 2012-12-10 ENCOUNTER — Telehealth: Payer: Self-pay | Admitting: Family Medicine

## 2012-12-10 NOTE — Telephone Encounter (Signed)
Patient is requesting refills on his Ambien.  Last seen 11/20/12.

## 2013-01-21 ENCOUNTER — Telehealth: Payer: Self-pay | Admitting: Internal Medicine

## 2013-01-21 NOTE — Telephone Encounter (Signed)
Last seen: 11/20/12 Last filled: 10/26/12 #30 with 2 refills Follow up: 04/25/13 Please advise.  Thanks!!

## 2013-01-21 NOTE — Telephone Encounter (Signed)
Pt needs refill of zolpidem (AMBIEN) 10 MG tablet. Pharm: [facility]

## 2013-01-21 NOTE — Telephone Encounter (Signed)
Ok to refill x 3  90 days

## 2013-01-22 ENCOUNTER — Other Ambulatory Visit: Payer: Self-pay | Admitting: Family Medicine

## 2013-01-22 MED ORDER — ZOLPIDEM TARTRATE 10 MG PO TABS: 10.0000 mg | ORAL_TABLET | Freq: Every evening | ORAL | Status: DC | PRN

## 2013-01-22 NOTE — Telephone Encounter (Signed)
Called and left on voicemail at [facility].

## 2013-02-02 IMAGING — RF DG RETROGRADE PYELOGRAM
1 series · 6 of 6 positions shown · non-contrast
Comparison: CT dated 07/06/2012

CLINICAL DATA: History of bladder tumor.

INTRAOPERATIVE LEFT RETROGRADE UROGRAPHY
TECHNIQUE: Images were obtained with the C-arm fluoroscopic device
intraoperatively and submitted for interpretation post-operatively.
Please see the procedural report for the amount of contrast and the
fluoroscopy time utilized.

[Series 1: run · 6 of 6 slices shown]
[im 1/6]
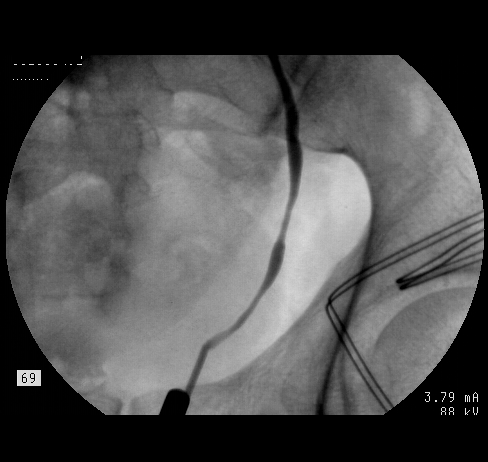
[im 2/6]
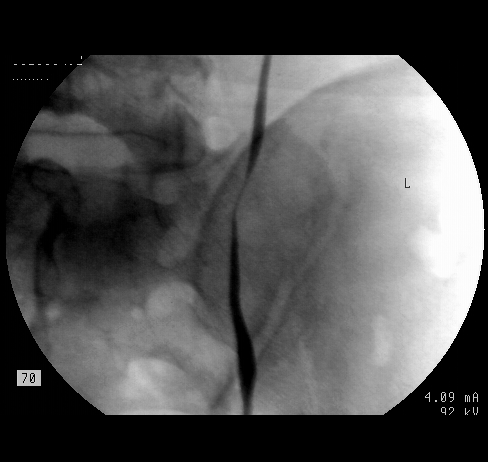
[im 3/6]
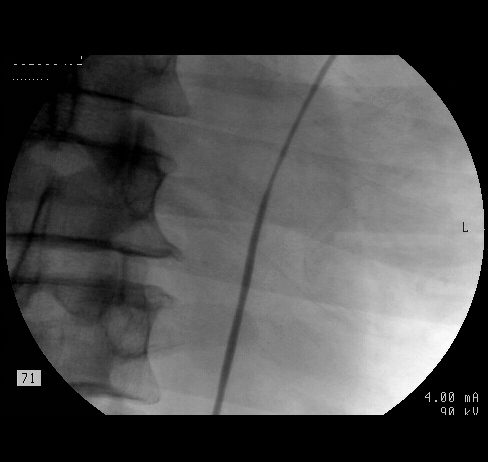
[im 4/6]
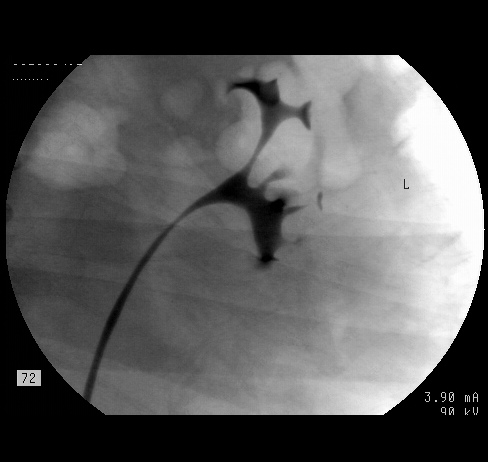
[im 5/6]
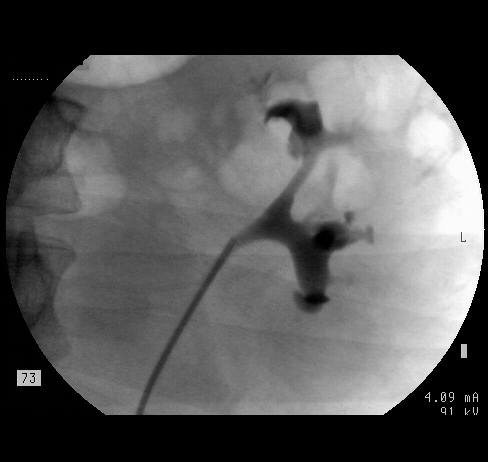
[im 6/6]
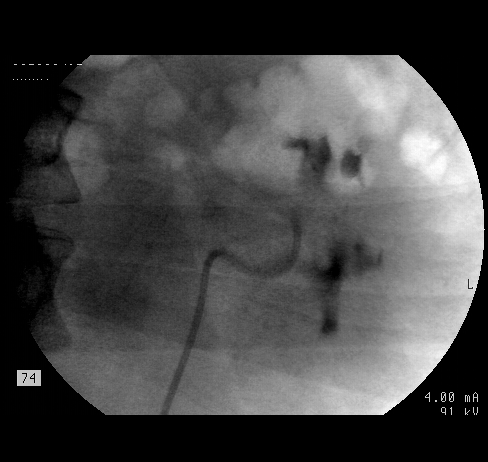

[6 of 6 positions shown; findings below may reference images not displayed]

FINDINGS: Imaging obtained with a C-arm demonstrates cannulation
of the left ureter by a cystoscope.  Contrast injection shows a
normal appearance to the ureter and renal collecting system.  A
ureteral stent was advanced with the proximal portion extending
into the central collecting system of the left kidney.
IMPRESSION: Unremarkable left retrograde pyelography.

## 2013-03-14 ENCOUNTER — Other Ambulatory Visit: Payer: Self-pay | Admitting: Internal Medicine

## 2013-04-25 ENCOUNTER — Ambulatory Visit: Payer: 59 | Admitting: Internal Medicine

## 2013-04-30 ENCOUNTER — Telehealth: Payer: Self-pay | Admitting: Family Medicine

## 2013-04-30 NOTE — Telephone Encounter (Signed)
Last filled on 01/22/13 #30 with 2 additional refills Last seen on 11/20/12 Has a future appointment on 05/30/13 Please advise.  Thanks!!

## 2013-05-01 ENCOUNTER — Other Ambulatory Visit: Payer: Self-pay | Admitting: Internal Medicine

## 2013-05-01 MED ORDER — ZOLPIDEM TARTRATE 10 MG PO TABS: 10.0000 mg | ORAL_TABLET | Freq: Every evening | ORAL | Status: DC | PRN

## 2013-05-01 NOTE — Telephone Encounter (Signed)
Can refill 30 x 3

## 2013-05-01 NOTE — Telephone Encounter (Signed)
Called to the pharmacy and left on voicemail. 

## 2013-05-30 ENCOUNTER — Ambulatory Visit (INDEPENDENT_AMBULATORY_CARE_PROVIDER_SITE_OTHER): Payer: 59 | Admitting: Internal Medicine

## 2013-05-30 ENCOUNTER — Encounter: Payer: Self-pay | Admitting: Internal Medicine

## 2013-05-30 VITALS — BP 120/92 | HR 74 | Temp 98.2°F | Wt 196.0 lb

## 2013-05-30 DIAGNOSIS — G8929 Other chronic pain: Secondary | ICD-10-CM

## 2013-05-30 DIAGNOSIS — R030 Elevated blood-pressure reading, without diagnosis of hypertension: Secondary | ICD-10-CM | POA: Insufficient documentation

## 2013-05-30 DIAGNOSIS — G4700 Insomnia, unspecified: Secondary | ICD-10-CM

## 2013-05-30 DIAGNOSIS — Z86010 Personal history of colonic polyps: Secondary | ICD-10-CM | POA: Insufficient documentation

## 2013-05-30 DIAGNOSIS — M542 Cervicalgia: Secondary | ICD-10-CM

## 2013-05-30 NOTE — Progress Notes (Signed)
Chief Complaint  Patient presents with  . Follow-up    HPI: FU meds here with sons today for their [facility]  HCM Due for colonoscopy  At [place] we'll plan on getting a tissue your  bp  Good systolic diastolic 92.  Range  No exercise. Except for his physical labor at work which is not consistent  Sleep ;  Taking 1/2 to 1 per night.   Ambien sleeps very well no residual the next day. Only occasional alcohol not with medicine. Tries off Ambien 2-3 nights and arrived doesn't sleep well is aware  Bladder cancer stable on an every 6 month check  Neck pain problematic at times remote history of full valuation with a spine surgeon resolved symptoms with physical therapy and possibly traction. Has had tingling in his left hand off and on for a while not now and no weakness. Asks about other interventions chiropractor physical therapy etc. currently no fever or weakness and no new trauma. Currently no radiation  ROS: See pertinent positives and negatives per HPI. No chest pain shortness of breath chronic cough.  Past Medical History  Diagnosis Date  . Insomnia     intermittent  . Colon polyps   . Bladder cancer   . Gross hematuria   . Gallbladder cholesterolosis intermittant  . Frequency of urination   . Urgency of urination   . Lower abdominal pain bladder area    Family History  Problem Relation Age of Onset  . Lung cancer Mother   . Colon cancer      fathers side   . ADD / ADHD Son     History   Social History  . Marital Status: Married    Spouse Name: N/A    Number of Children: N/A  . Years of Education: N/A   Social History Main Topics  . Smoking status: Former Smoker -- 1.00 packs/day for 10 years    Types: Cigarettes    Quit date: 10/24/2001  . Smokeless tobacco: Never Used  . Alcohol Use: 1.0 oz/week    2 drink(s) per week  . Drug Use: No  . Sexually Active: None   Other Topics Concern  . None   Social History Narrative   hhof 5    Married    See  centricity  EHR   No currently smoking.Marland Kitchen ex tobacco    Carpentry work    Outpatient Encounter Prescriptions as of 05/30/2013  Medication Sig Dispense Refill  . cyclobenzaprine (FLEXERIL) 10 MG tablet TAKE 1 TABLET THREE TIMES DAILY AS NEEDED FOR MUSCLE SPASMS.  40 tablet  0  . EPINEPHrine (EPIPEN) 0.3 mg/0.3 mL DEVI Inject 0.3 mLs (0.3 mg total) into the muscle once.  1 Device  0  . ondansetron (ZOFRAN) 4 MG tablet Take 4 mg by mouth every 6 (six) hours as needed for nausea.      Marland Kitchen zolpidem (AMBIEN) 10 MG tablet Take 1 tablet (10 mg total) by mouth at bedtime as needed for sleep.  30 tablet  2   No facility-administered encounter medications on file as of 05/30/2013.    EXAM:  BP 120/92  Pulse 74  Temp(Src) 98.2 F (36.8 C) (Oral)  Wt 196 lb (88.905 kg)  BMI 26.58 kg/m2  SpO2 98%  Body mass index is 26.58 kg/(m^2).  GENERAL: vitals reviewed and listed above, alert, oriented, appears well hydrated and in no acute distress  HEENT: atraumatic, conjunctiva  clear, no obvious abnormalities on inspection of external nose and ears OP :  no lesion edema or exudate  NECK: no obvious masses on inspection palpation no point tenderness some discomfort on extension slight stiffness LUNGS: clear to auscultation bilaterally, no wheezes, rales or rhonchi, good air movement CV: HRRR, no clubbing cyanosis or  peripheral edema nl cap refill  MS: moves all extremities without noticeable focal  abnormality no obvious atrophy or weakness. PSYCH: pleasant and cooperative, no obvious depression or anxiety  ASSESSMENT AND PLAN:  Discussed the following assessment and plan:  Chronic neck pain - hx poss disc  nboncurrent neuro sx  - Plan: Ambulatory referral to Physical Therapy  INSOMNIA-SLEEP DISORDER-UNSPEC  Hx of colonic polyp - due for colonoscopy  please get this . says he will   Borderline high blood pressure - diastolic 90 range  lsi andexericse and fu if elevate to   treat.  -Patient advised  to return or notify health care team  if symptoms worsen or persist or new concerns arise.  Patient Instructions  Get your colonoscopy . Call for refill of ambien  . You will be contacted about the physical therapy referral. Check blood pressure readings   Intensify exercise as discussed . To get diastolic bp below 90  .  ROV  6 months med check.      Neta Mends. Panosh M.D. Health Maintenance  Topic Date Due  . Colonoscopy  07/25/2010  . Influenza Vaccine  06/24/2013  . Tetanus/tdap  08/04/2020   Health Maintenance Review }

## 2013-05-30 NOTE — Patient Instructions (Signed)
Get your colonoscopy . Call for refill of ambien  . You will be contacted about the physical therapy referral. Check blood pressure readings   Intensify exercise as discussed . To get diastolic bp below 90  .  ROV  6 months med check.

## 2013-06-17 ENCOUNTER — Ambulatory Visit: Payer: 59 | Attending: Internal Medicine | Admitting: Physical Therapy

## 2013-06-17 DIAGNOSIS — M542 Cervicalgia: Secondary | ICD-10-CM | POA: Insufficient documentation

## 2013-06-17 DIAGNOSIS — IMO0001 Reserved for inherently not codable concepts without codable children: Secondary | ICD-10-CM | POA: Insufficient documentation

## 2013-06-21 ENCOUNTER — Ambulatory Visit: Payer: 59 | Admitting: Physical Therapy

## 2013-06-25 ENCOUNTER — Ambulatory Visit: Payer: 59 | Attending: Internal Medicine | Admitting: Physical Therapy

## 2013-06-25 ENCOUNTER — Telehealth (INDEPENDENT_AMBULATORY_CARE_PROVIDER_SITE_OTHER): Payer: Self-pay | Admitting: General Surgery

## 2013-06-25 ENCOUNTER — Ambulatory Visit (INDEPENDENT_AMBULATORY_CARE_PROVIDER_SITE_OTHER): Payer: 59 | Admitting: General Surgery

## 2013-06-25 ENCOUNTER — Encounter (INDEPENDENT_AMBULATORY_CARE_PROVIDER_SITE_OTHER): Payer: Self-pay | Admitting: General Surgery

## 2013-06-25 VITALS — BP 136/88 | HR 68 | Resp 16 | Ht 73.0 in | Wt 199.0 lb

## 2013-06-25 DIAGNOSIS — M542 Cervicalgia: Secondary | ICD-10-CM | POA: Insufficient documentation

## 2013-06-25 DIAGNOSIS — IMO0001 Reserved for inherently not codable concepts without codable children: Secondary | ICD-10-CM | POA: Insufficient documentation

## 2013-06-25 DIAGNOSIS — K8010 Calculus of gallbladder with chronic cholecystitis without obstruction: Secondary | ICD-10-CM | POA: Insufficient documentation

## 2013-06-25 NOTE — Assessment & Plan Note (Signed)
Patient appears to have classic symptoms of chronic cholecystitis. He always has a dull ache in his right upper quadrant. I think he would benefit from cholecystectomy.  The surgical procedure was described to the patient in detail.  The patient was given Agricultural engineer. .  I discussed the incision type and location, the location of the gallbladder, the anatomy of the bile ducts and arteries, and the typical progression of surgery.  I discussed the possibility of converting to an open operation.  I advised of the risks of bleeding, infection, damage to other structures (such as the bile duct, intestine or liver), bile leak, need for other procedures or surgeries, and post op diarrhea/constipation.  We discussed the risk of blood clot.  We discussed the recovery period and post operative restrictions.  The patient was advised against taking blood thinners the week before surgery.     The patient would like to wait until November or December because of work concerns. He'll need to see him back for a preoperative appointment within 30 days of surgery.

## 2013-06-25 NOTE — Progress Notes (Signed)
Chief complaint:  Gallbladder disease.    HISTORY: Patient is a 52 year old male with the diagnosis of bladder cancer who is referred by Dr. Berneice Heinrich for consultation regarding his gallbladder.  The patient states that he has had issues of his gallbladder for around 15 years. Originally he had a few severe episodes of pain that he started eating less fatty and greasy foods and did not have any significant problems for a long period of time. He recently has started having more soreness in the right upper quadrant. He constantly feels some discomfort there.  He continues to try to avoid greasy and fatty foods. He had cystoscopy to treat his bladder cancer. He does think he had a brother with gallbladder disease that required cholecystectomy.  He denies fever/chills/jaundice.    Past Medical History  Diagnosis Date  . Insomnia     intermittent  . Colon polyps   . Bladder cancer   . Gross hematuria   . Gallbladder cholesterolosis intermittant  . Frequency of urination   . Urgency of urination   . Lower abdominal pain bladder area    Past Surgical History  Procedure Laterality Date  . Tonsillectomy  as child  . Hernia repair  as child    right inguinal  . Transurethral resection of bladder tumor  10/03/2012    Procedure: TRANSURETHRAL RESECTION OF BLADDER TUMOR (TURBT);  Surgeon: Sebastian Ache, MD;  Location: [facility];  Service: Urology;  Laterality: N/A;  . Cystoscopy with retrograde pyelogram, ureteroscopy and stent placement  10/03/2012    Procedure: CYSTOSCOPY WITH RETROGRADE PYELOGRAM, URETEROSCOPY AND STENT PLACEMENT;  Surgeon: Sebastian Ache, MD;  Location: [facility];  Service: Urology;  Laterality: Bilateral;  POSSIBLE LEFT STENT PLACEMENT    Current Outpatient Prescriptions  Medication Sig Dispense Refill  . cyclobenzaprine (FLEXERIL) 10 MG tablet TAKE 1 TABLET THREE TIMES DAILY AS NEEDED FOR MUSCLE SPASMS.  40 tablet  0  . EPINEPHrine (EPIPEN) 0.3  mg/0.3 mL DEVI Inject 0.3 mLs (0.3 mg total) into the muscle once.  1 Device  0  . ondansetron (ZOFRAN) 4 MG tablet Take 4 mg by mouth every 6 (six) hours as needed for nausea.      Marland Kitchen zolpidem (AMBIEN) 10 MG tablet Take 1 tablet (10 mg total) by mouth at bedtime as needed for sleep.  30 tablet  2   No current facility-administered medications for this visit.     Allergies  Allergen Reactions  . Bee Venom Anaphylaxis  . Ciprofloxacin Hives  . Penicillins Other (See Comments)    unknown     Family History  Problem Relation Age of Onset  . Lung cancer Mother   . Colon cancer      fathers side   . ADD / ADHD Son      History   Social History  . Marital Status: Married    Spouse Name: N/A    Number of Children: N/A  . Years of Education: N/A   Social History Main Topics  . Smoking status: Former Smoker -- 1.00 packs/day for 10 years    Types: Cigarettes    Quit date: 10/24/2001  . Smokeless tobacco: Never Used  . Alcohol Use: 1.0 oz/week    2 drink(s) per week  . Drug Use: No  . Sexual Activity: None   Other Topics Concern  . None   Social History Narrative   hhof 5    Married    See Arts development officer  No currently smoking.Marland Kitchen ex tobacco    Carpentry work     REVIEW OF SYSTEMS - PERTINENT POSITIVES ONLY: 12 point review of systems negative other than HPI and PMH except for abdominal pain  EXAM: Filed Vitals:   06/25/13 1341  BP: 136/88  Pulse: 68  Resp: 16   Filed Weights   06/25/13 1341  Weight: 199 lb (90.266 kg)     Gen:  No acute distress.  Well nourished and well groomed.   Neurological: Alert and oriented to person, place, and time. Coordination normal.  Head: Normocephalic and atraumatic.  Eyes: Conjunctivae are normal. Pupils are equal, round, and reactive to light. No scleral icterus.  Neck: Normal range of motion. Neck supple. No tracheal deviation or thyromegaly present.  Cardiovascular: Normal rate, regular rhythm, normal heart sounds  and intact distal pulses.  Exam reveals no gallop and no friction rub.  No murmur heard. Respiratory: Effort normal.  No respiratory distress. No chest wall tenderness. Breath sounds normal.  No wheezes, rales or rhonchi.  GI: Soft. Bowel sounds are normal. The abdomen is soft and nondistended.  The RUQ is mildly tender.  There is no rebound and no guarding.  Musculoskeletal: Normal range of motion. Extremities are nontender.  Lymphadenopathy: No cervical, preauricular, postauricular or axillary adenopathy is present Skin: Skin is warm and dry. No rash noted. No diaphoresis. No erythema. No pallor. No clubbing, cyanosis, or edema.   Psychiatric: Normal mood and affect. Behavior is normal. Judgment and thought content normal.    LABORATORY RESULTS: Available labs are reviewed  No lfts available.    RADIOLOGY RESULTS: See E-Chart or I-Site for most recent results.  Images and reports are reviewed. U/s IMPRESSION:  1. Mild asymmetric thickening of the left posterior bladder wall.  Bladder carcinoma cannot be excluded, and cystoscopy is recommended  for further evaluation.  2. No evidence of upper urinary tract disease or metastatic  disease.  3. Cholelithiasis and diverticulosis incidentally noted.     ASSESSMENT AND PLAN: Chronic cholecystitis with calculus Patient appears to have classic symptoms of chronic cholecystitis. He always has a dull ache in his right upper quadrant. I think he would benefit from cholecystectomy.  The surgical procedure was described to the patient in detail.  The patient was given Agricultural engineer. .  I discussed the incision type and location, the location of the gallbladder, the anatomy of the bile ducts and arteries, and the typical progression of surgery.  I discussed the possibility of converting to an open operation.  I advised of the risks of bleeding, infection, damage to other structures (such as the bile duct, intestine or liver), bile leak, need  for other procedures or surgeries, and post op diarrhea/constipation.  We discussed the risk of blood clot.  We discussed the recovery period and post operative restrictions.  The patient was advised against taking blood thinners the week before surgery.     The patient would like to wait until November or December because of work concerns. He'll need to see him back for a preoperative appointment within 30 days of surgery.     Maudry Diego MD Surgical Oncology, General and Endocrine Surgery [facility]      Visit Diagnoses: 1. Chronic cholecystitis with calculus     Primary Care Physician: Lorretta Harp, MD

## 2013-06-25 NOTE — Patient Instructions (Signed)

## 2013-06-25 NOTE — Telephone Encounter (Signed)
Met with surgery scheduling, wants to schedule in December 2014, placed in pending file for December.

## 2013-06-29 ENCOUNTER — Other Ambulatory Visit: Payer: Self-pay | Admitting: Internal Medicine

## 2013-07-01 NOTE — Telephone Encounter (Signed)
Last filled on 03/14/13 #40 with 0 additional refills Last seen on 05/30/13 No future appt. Please advise. Thanks!!

## 2013-07-02 NOTE — Telephone Encounter (Signed)
Ok x 1

## 2013-07-05 ENCOUNTER — Ambulatory Visit: Payer: 59 | Admitting: Physical Therapy

## 2013-07-23 ENCOUNTER — Telehealth: Payer: Self-pay | Admitting: Family Medicine

## 2013-07-23 ENCOUNTER — Other Ambulatory Visit: Payer: Self-pay | Admitting: Family Medicine

## 2013-07-23 MED ORDER — ZOLPIDEM TARTRATE 10 MG PO TABS: 10.0000 mg | ORAL_TABLET | Freq: Every evening | ORAL | Status: DC | PRN

## 2013-07-23 NOTE — Telephone Encounter (Signed)
Last filled on 05/01/13 #30 with 2 additional refills Has future CPE scheduled on 12/04/13 Last seen on 05/30/13 Please advise. Thanks!

## 2013-07-23 NOTE — Telephone Encounter (Signed)
Called to the pharmacy and left on voicemail. 

## 2013-07-23 NOTE — Telephone Encounter (Signed)
Ok x 90 days  

## 2013-08-06 ENCOUNTER — Telehealth (INDEPENDENT_AMBULATORY_CARE_PROVIDER_SITE_OTHER): Payer: Self-pay | Admitting: General Surgery

## 2013-08-06 NOTE — Telephone Encounter (Signed)
Multiple messages have been left for patient regarding scheduling her surgery. Messages left on 07/31/13 and 08/06/13. Face sheet placed in pending folder.

## 2013-08-08 ENCOUNTER — Telehealth: Payer: Self-pay | Admitting: Gastroenterology

## 2013-08-08 ENCOUNTER — Ambulatory Visit (AMBULATORY_SURGERY_CENTER): Payer: 59

## 2013-08-08 VITALS — Ht 73.0 in | Wt 187.0 lb

## 2013-08-08 DIAGNOSIS — Z1211 Encounter for screening for malignant neoplasm of colon: Secondary | ICD-10-CM

## 2013-08-08 MED ORDER — SOD PICOSULFATE-MAG OX-CIT ACD 10-3.5-12 MG-GM-GM PO PACK: 1.0000 | PACK | Freq: Once | ORAL | Status: DC

## 2013-08-08 NOTE — Telephone Encounter (Signed)
Pharmacist didn't know what the order was for since wasn't suprep or moviprep. Instructed was prepopik and pharmacist verbalized understanding. emw

## 2013-08-09 ENCOUNTER — Encounter: Payer: Self-pay | Admitting: Gastroenterology

## 2013-08-20 ENCOUNTER — Ambulatory Visit: Payer: 59

## 2013-08-20 ENCOUNTER — Ambulatory Visit (INDEPENDENT_AMBULATORY_CARE_PROVIDER_SITE_OTHER): Payer: 59

## 2013-08-20 DIAGNOSIS — Z23 Encounter for immunization: Secondary | ICD-10-CM

## 2013-08-22 ENCOUNTER — Ambulatory Visit (AMBULATORY_SURGERY_CENTER): Payer: 59 | Admitting: Gastroenterology

## 2013-08-22 ENCOUNTER — Encounter: Payer: Self-pay | Admitting: Gastroenterology

## 2013-08-22 VITALS — BP 119/78 | HR 53 | Temp 96.7°F | Resp 16 | Ht 73.0 in | Wt 187.0 lb

## 2013-08-22 DIAGNOSIS — Z800 Family history of malignant neoplasm of digestive organs: Secondary | ICD-10-CM

## 2013-08-22 DIAGNOSIS — D126 Benign neoplasm of colon, unspecified: Secondary | ICD-10-CM

## 2013-08-22 DIAGNOSIS — Z1211 Encounter for screening for malignant neoplasm of colon: Secondary | ICD-10-CM

## 2013-08-22 MED ORDER — SODIUM CHLORIDE 0.9 % IV SOLN: 500.0000 mL | INTRAVENOUS | Status: DC

## 2013-08-22 NOTE — Op Note (Signed)
[facility] [address] [place], [address]   COLONOSCOPY PROCEDURE REPORT  PATIENT: [name], [name]  MR#: [id number] BIRTHDATE: [birth year] , 52  yrs. old GENDER: Male ENDOSCOPIST: Meryl Dare, MD, [facility] REFERRED XB:JYNWG Lonie Peak, M.D. PROCEDURE DATE:  08/22/2013 PROCEDURE:   Colonoscopy with snare polypectomy First Screening Colonoscopy - Avg.  risk and is 50 yrs.  old or older Yes.  Prior Negative Screening - Now for repeat screening. N/A  History of Adenoma - Now for follow-up colonoscopy & has been > or = to 3 yrs.  N/A  Polyps Removed Today? Yes. ASA CLASS:   Class II INDICATIONS:elevated risk screening and Patient's family history of colon cancer, distant relatives. MEDICATIONS: MAC sedation, administered by CRNA and propofol (Diprivan) 300mg  IV DESCRIPTION OF PROCEDURE:   After the risks benefits and alternatives of the procedure were thoroughly explained, informed consent was obtained.  A digital rectal exam revealed no abnormalities of the rectum.   The LB [id number]  endoscope was introduced through the anus and advanced to the cecum, which was identified by both the appendix and ileocecal valve. No adverse events experienced.   The quality of the prep was Prepopik excellent  The instrument was then slowly withdrawn as the colon was fully examined.  COLON FINDINGS: A sessile polyp measuring 5 mm in size was found in the sigmoid colon.  A polypectomy was performed with a cold snare. The resection was complete and the polyp tissue was completely retrieved.   Moderate diverticulosis was noted in the descending colon and sigmoid colon.   The colon was otherwise normal.  There was no diverticulosis, inflammation, polyps or cancers unless previously stated.  Retroflexed views revealed no abnormalities. The time to cecum=3 minutes 00 seconds.  Withdrawal time=8 minutes 33 seconds.  The scope was withdrawn and the procedure  completed.  COMPLICATIONS: There were no complications.  ENDOSCOPIC IMPRESSION: 1.   Sessile polyp measuring 5 mm in the sigmoid colon; polypectomy performed with a cold snare 2.   Moderate diverticulosis in the descending colon and sigmoid colon  RECOMMENDATIONS: 1.  Await pathology results 2.  High fiber diet with liberal fluid intake. 3.  Repeat Colonoscopy in 5 years.  eSigned:  Meryl Dare, MD, [facility] 08/22/2013 11:54 AM

## 2013-08-22 NOTE — Progress Notes (Signed)
Patient did not have preoperative order for IV antibiotic SSI prophylaxis. (G8918)  Patient did not experience any of the following events: a burn prior to discharge; a fall within the facility; wrong site/side/patient/procedure/implant event; or a hospital transfer or hospital admission upon discharge from the facility. (G8907)  

## 2013-08-22 NOTE — Patient Instructions (Signed)
YOU HAD AN ENDOSCOPIC PROCEDURE TODAY AT THE [facility]: Refer to the procedure report that was given to you for any specific questions about what was found during the examination.  If the procedure report does not answer your questions, please call your gastroenterologist to clarify.  If you requested that your care partner not be given the details of your procedure findings, then the procedure report has been included in a sealed envelope for you to review at your convenience later.  YOU SHOULD EXPECT: Some feelings of bloating in the abdomen. Passage of more gas than usual.  Walking can help get rid of the air that was put into your GI tract during the procedure and reduce the bloating. If you had a lower endoscopy (such as a colonoscopy or flexible sigmoidoscopy) you may notice spotting of blood in your stool or on the toilet paper. If you underwent a bowel prep for your procedure, then you may not have a normal bowel movement for a few days.  DIET: Your first meal following the procedure should be a light meal and then it is ok to progress to your normal diet.  A half-sandwich or bowl of soup is an example of a good first meal.  Heavy or fried foods are harder to digest and may make you feel nauseous or bloated.  Likewise meals heavy in dairy and vegetables can cause extra gas to form and this can also increase the bloating.  Drink plenty of fluids but you should avoid alcoholic beverages for 24 hours.  ACTIVITY: Your care partner should take you home directly after the procedure.  You should plan to take it easy, moving slowly for the rest of the day.  You can resume normal activity the day after the procedure however you should NOT DRIVE or use heavy machinery for 24 hours (because of the sedation medicines used during the test).    SYMPTOMS TO REPORT IMMEDIATELY: A gastroenterologist can be reached at any hour.  During normal business hours, 8:30 AM to 5:00 PM Monday through Friday,  call [phone].  After hours and on weekends, please call the GI answering service at [phone] who will take a message and have the physician on call contact you.   Following lower endoscopy (colonoscopy or flexible sigmoidoscopy):  Excessive amounts of blood in the stool  Significant tenderness or worsening of abdominal pains  Swelling of the abdomen that is new, acute  Fever of 100F or higher    FOLLOW UP: If any biopsies were taken you will be contacted by phone or by letter within the next 1-3 weeks.  Call your gastroenterologist if you have not heard about the biopsies in 3 weeks.  Our staff will call the home number listed on your records the next business day following your procedure to check on you and address any questions or concerns that you may have at that time regarding the information given to you following your procedure. This is a courtesy call and so if there is no answer at the home number and we have not heard from you through the emergency physician on call, we will assume that you have returned to your regular daily activities without incident.  SIGNATURES/CONFIDENTIALITY: You and/or your care partner have signed paperwork which will be entered into your electronic medical record.  These signatures attest to the fact that that the information above on your After Visit Summary has been reviewed and is understood.  Full responsibility of the confidentiality   of this discharge information lies with you and/or your care-partner.     

## 2013-08-22 NOTE — Progress Notes (Signed)
Called to room to assist during endoscopic procedure.  Patient ID and intended procedure confirmed with present staff. Received instructions for my participation in the procedure from the performing physician.  

## 2013-08-22 NOTE — Progress Notes (Signed)
Lidocaine-40mg IV prior to Propofol InductionPropofol given over incremental dosages 

## 2013-08-23 ENCOUNTER — Telehealth: Payer: Self-pay

## 2013-08-23 NOTE — Telephone Encounter (Signed)
Left message on answering machine. 

## 2013-08-29 ENCOUNTER — Encounter: Payer: Self-pay | Admitting: Gastroenterology

## 2013-09-25 ENCOUNTER — Telehealth: Payer: Self-pay | Admitting: Family Medicine

## 2013-09-25 ENCOUNTER — Other Ambulatory Visit: Payer: Self-pay | Admitting: Family Medicine

## 2013-09-25 MED ORDER — ONDANSETRON HCL 4 MG PO TABS: 4.0000 mg | ORAL_TABLET | Freq: Four times a day (QID) | ORAL | Status: DC | PRN

## 2013-09-25 NOTE — Telephone Encounter (Signed)
Ok to fill 20 refill x 1

## 2013-09-25 NOTE — Telephone Encounter (Signed)
Patient called to report that his family has a stomach virus.  Everyone does not feel well and is very nauseous.  He is requesting Zofran 4mg  to be sent to [facility].  Please advise.  Thanks!

## 2013-09-26 NOTE — Telephone Encounter (Signed)
Sent to the pharmacy

## 2013-10-10 ENCOUNTER — Emergency Department (HOSPITAL_COMMUNITY)
Admission: EM | Admit: 2013-10-10 | Discharge: 2013-10-10 | Disposition: A | Discharge status: Home / Self Care | Payer: 59 | Attending: Emergency Medicine | Admitting: Emergency Medicine

## 2013-10-10 DIAGNOSIS — Z87448 Personal history of other diseases of urinary system: Secondary | ICD-10-CM | POA: Insufficient documentation

## 2013-10-10 DIAGNOSIS — S0100XA Unspecified open wound of scalp, initial encounter: Secondary | ICD-10-CM | POA: Insufficient documentation

## 2013-10-10 DIAGNOSIS — S0101XA Laceration without foreign body of scalp, initial encounter: Secondary | ICD-10-CM

## 2013-10-10 DIAGNOSIS — W2209XA Striking against other stationary object, initial encounter: Secondary | ICD-10-CM | POA: Insufficient documentation

## 2013-10-10 DIAGNOSIS — Z880 Allergy status to penicillin: Secondary | ICD-10-CM | POA: Insufficient documentation

## 2013-10-10 DIAGNOSIS — Z8719 Personal history of other diseases of the digestive system: Secondary | ICD-10-CM | POA: Insufficient documentation

## 2013-10-10 DIAGNOSIS — Y9383 Activity, rough housing and horseplay: Secondary | ICD-10-CM | POA: Insufficient documentation

## 2013-10-10 DIAGNOSIS — G4700 Insomnia, unspecified: Secondary | ICD-10-CM | POA: Insufficient documentation

## 2013-10-10 DIAGNOSIS — Z8551 Personal history of malignant neoplasm of bladder: Secondary | ICD-10-CM | POA: Insufficient documentation

## 2013-10-10 DIAGNOSIS — Z860100 Personal history of colon polyps, unspecified: Secondary | ICD-10-CM | POA: Insufficient documentation

## 2013-10-10 DIAGNOSIS — Y929 Unspecified place or not applicable: Secondary | ICD-10-CM | POA: Insufficient documentation

## 2013-10-10 DIAGNOSIS — Z87891 Personal history of nicotine dependence: Secondary | ICD-10-CM | POA: Insufficient documentation

## 2013-10-10 NOTE — ED Provider Notes (Signed)
Medical screening examination/treatment/procedure(s) were performed by non-physician practitioner and as supervising physician I was immediately available for consultation/collaboration.  Lyanne Co, MD 10/10/13 [phone]

## 2013-10-10 NOTE — ED Provider Notes (Signed)
CSN: [id number]     Arrival date & time 10/10/13  2149 History  This chart was scribed for non-physician practitioner Earley Favor, NP working with Lyanne Co, MD by Clydene Laming, ED Scribe. This patient was seen in room WTR7/WTR7 and the patient's care was started at 10:20 PM.   Chief Complaint  Patient presents with  . Head Laceration    The history is provided by the patient. No language interpreter was used.   HPI Comments: [name] is a 52 y.o. male who presents to the Emergency Department complaining of a laceration located over the top of the head. Pt states he was "horsing around with the kids" and hit his head on the fireplace. Pt denies LOC. Denies n/v. Pt is a/o x 4. Pt thinks he is up to date on his tetanus status.   Past Medical History  Diagnosis Date  . Insomnia     intermittent  . Colon polyps   . Bladder cancer   . Gross hematuria   . Gallbladder cholesterolosis intermittant  . Frequency of urination   . Urgency of urination   . Lower abdominal pain bladder area   Past Surgical History  Procedure Laterality Date  . Tonsillectomy  as child  . Hernia repair  as child    right inguinal  . Transurethral resection of bladder tumor  10/03/2012    Procedure: TRANSURETHRAL RESECTION OF BLADDER TUMOR (TURBT);  Surgeon: Sebastian Ache, MD;  Location: [facility];  Service: Urology;  Laterality: N/A;  . Cystoscopy with retrograde pyelogram, ureteroscopy and stent placement  10/03/2012    Procedure: CYSTOSCOPY WITH RETROGRADE PYELOGRAM, URETEROSCOPY AND STENT PLACEMENT;  Surgeon: Sebastian Ache, MD;  Location: [facility];  Service: Urology;  Laterality: Bilateral;  POSSIBLE LEFT STENT PLACEMENT   Family History  Problem Relation Age of Onset  . Lung cancer Mother   . Colon cancer      fathers side   . ADD / ADHD Son    History  Substance Use Topics  . Smoking status: Former Smoker -- 1.00 packs/day for 10 years    Types:  Cigarettes    Quit date: 10/24/2001  . Smokeless tobacco: Never Used  . Alcohol Use: 1.0 oz/week    2 drink(s) per week    Review of Systems  Eyes: Negative for visual disturbance.  Skin: Positive for wound.       Head laceration   Neurological: Negative for dizziness, syncope and headaches.  All other systems reviewed and are negative.    Allergies  Bee venom and Penicillins  Home Medications   Current Outpatient Rx  Name  Route  Sig  Dispense  Refill  . cyclobenzaprine (FLEXERIL) 10 MG tablet      TAKE 1 TABLET THREE TIMES DAILY AS NEEDED FOR MUSCLE SPASMS.   40 tablet   0   . Diphenhydramine-APAP, sleep, (TYLENOL PM EXTRA STRENGTH) 50-1000 MG/30ML LIQD   Oral   Take 30 mLs by mouth every 12 (twelve) hours.         Marland Kitchen EPINEPHrine (EPIPEN) 0.3 mg/0.3 mL DEVI   Intramuscular   Inject 0.3 mLs (0.3 mg total) into the muscle once.   1 Device   0   . ondansetron (ZOFRAN) 4 MG tablet   Oral   Take 1 tablet (4 mg total) by mouth every 6 (six) hours as needed for nausea.   20 tablet   1   . Pseudoephedrine-APAP-DM 30-325-15 MG/15ML LIQD  Oral   Take 15 mLs by mouth every 6 (six) hours as needed (cold symptoms).         . zolpidem (AMBIEN) 10 MG tablet   Oral   Take 1 tablet (10 mg total) by mouth at bedtime as needed for sleep.   30 tablet   2    Triage Vitals:BP 140/96  Pulse 96  Temp(Src) 98.3 F (36.8 C) (Oral)  Resp 16  SpO2 99% Physical Exam  Nursing note and vitals reviewed. Constitutional: He is oriented to person, place, and time. He appears well-developed and well-nourished.  HENT:  Head: Normocephalic and atraumatic.  Right Ear: External ear normal.  Left Ear: External ear normal.  Eyes: EOM are normal.  Neck: Normal range of motion. No tracheal deviation present.  Cardiovascular: Normal rate and regular rhythm.   Pulmonary/Chest: Effort normal.  Musculoskeletal: Normal range of motion.  Neurological: He is alert and oriented to person,  place, and time.  Skin:  Laceration over the top of the head     ED Course  Procedures (including critical care time) DIAGNOSTIC STUDIES: Oxygen Saturation is 99% on RA, normal by my interpretation.    COORDINATION OF CARE: 10:24 PM- Discussed treatment plan with pt at bedside. Pt verbalized understanding and agreement with plan.   Labs Review Labs Reviewed - No data to display Imaging Review No results found.  EKG Interpretation   None       MDM   1. Scalp laceration, initial encounter    I personally performed the services described in this documentation, which was scribed in my presence. The recorded information has been reviewed and is accurate.  LACERATION REPAIR Performed by: Earley Favor, NP Consent: Verbal consent obtained. Risks and benefits: risks, benefits and alternatives were discussed Patient identity confirmed: provided demographic data Time out performed prior to procedure Prepped and Draped in normal sterile fashion Wound explored Laceration Location: top of the head Laceration Length: 4 cm No Foreign Bodies seen or palpated Anesthesia: local infiltration Local anesthetic: lidocaine 2% with epinephrine Anesthetic total: 4 cc's Irrigation method: syringe Amount of cleaning: standard Number of sutures or staples: 6 Patient tolerance: Patient tolerated the procedure well with no immediate complications.  Arman Filter, NP 10/10/13 1610  Arman Filter, NP 10/10/13 2310  Arman Filter, NP 10/10/13 2312

## 2013-10-10 NOTE — ED Notes (Signed)
Pt states he was "horsing around with the kids" and hit his head on the fireplace. Pt has a lac to the top of his head near his forehead. Pt denies LOC. Denies n/v. Pt is a/o x 4. Pt thinks he is up to date on his tetanus status.

## 2013-10-14 ENCOUNTER — Other Ambulatory Visit: Payer: Self-pay | Admitting: Internal Medicine

## 2013-10-14 NOTE — Telephone Encounter (Signed)
Last filled on 07/23/13 #30 with 2 additional refills Has a future follow up appt on 12/04/13 Last seen on 05/30/13 Please advise. Thanks!

## 2013-10-14 NOTE — Telephone Encounter (Signed)
Ok to refill x 3 ( #30) 

## 2013-11-09 ENCOUNTER — Encounter: Payer: Self-pay | Admitting: Endocrinology

## 2013-11-09 ENCOUNTER — Ambulatory Visit (INDEPENDENT_AMBULATORY_CARE_PROVIDER_SITE_OTHER): Payer: 59 | Admitting: Endocrinology

## 2013-11-09 VITALS — BP 118/80 | HR 82 | Temp 99.1°F | Wt 195.0 lb

## 2013-11-09 DIAGNOSIS — J069 Acute upper respiratory infection, unspecified: Secondary | ICD-10-CM

## 2013-11-09 MED ORDER — AZITHROMYCIN 500 MG PO TABS: 500.0000 mg | ORAL_TABLET | Freq: Every day | ORAL | Status: DC

## 2013-11-09 MED ORDER — PROMETHAZINE-CODEINE 6.25-10 MG/5ML PO SYRP: 5.0000 mL | ORAL_SOLUTION | ORAL | Status: DC | PRN

## 2013-11-09 NOTE — Patient Instructions (Signed)
i have sent a prescription to your pharmacy, for an antibiotic pill. Loratadine-d (non-prescription) will help your congestion.   Here is a prescription for cough syrup.  You should skip ambien while on this.   I hope you feel better soon.  If you don't feel better by next week, please call back.  Please call sooner if you get worse.

## 2013-11-09 NOTE — Progress Notes (Signed)
Subjective:    Patient ID: [name], male    DOB: 20-Aug-1961, 53 y.o.   MRN: [id number]  HPI Pt states 2 days of moderate prod-quality cough in the chest, and assoc headache.   Past Medical History  Diagnosis Date  . Insomnia     intermittent  . Colon polyps   . Bladder cancer   . Gross hematuria   . Gallbladder cholesterolosis intermittant  . Frequency of urination   . Urgency of urination   . Lower abdominal pain bladder area    Past Surgical History  Procedure Laterality Date  . Tonsillectomy  as child  . Hernia repair  as child    right inguinal  . Transurethral resection of bladder tumor  10/03/2012    Procedure: TRANSURETHRAL RESECTION OF BLADDER TUMOR (TURBT);  Surgeon: Alexis Frock, MD;  Location: [facility];  Service: Urology;  Laterality: N/A;  . Cystoscopy with retrograde pyelogram, ureteroscopy and stent placement  10/03/2012    Procedure: [place], URETEROSCOPY AND STENT PLACEMENT;  Surgeon: Alexis Frock, MD;  Location: [facility];  Service: Urology;  Laterality: Bilateral;  POSSIBLE LEFT STENT PLACEMENT    History   Social History  . Marital Status: Married    Spouse Name: N/A    Number of Children: N/A  . Years of Education: N/A   Occupational History  . Not on file.   Social History Main Topics  . Smoking status: Former Smoker -- 1.00 packs/day for 10 years    Types: Cigarettes    Quit date: 10/24/2001  . Smokeless tobacco: Never Used  . Alcohol Use: 1.0 oz/week    2 drink(s) per week  . Drug Use: No  . Sexual Activity: Not on file   Other Topics Concern  . Not on file   Social History Narrative   hhof 5    Married    See centricity  EHR   No currently smoking.Marland Kitchen ex tobacco    Carpentry work    Current Outpatient Prescriptions on File Prior to Visit  Medication Sig Dispense Refill  . cyclobenzaprine (FLEXERIL) 10 MG tablet TAKE 1 TABLET THREE TIMES DAILY AS NEEDED FOR  MUSCLE SPASMS.  40 tablet  0  . EPINEPHrine (EPIPEN) 0.3 mg/0.3 mL DEVI Inject 0.3 mLs (0.3 mg total) into the muscle once.  1 Device  0  . ondansetron (ZOFRAN) 4 MG tablet Take 1 tablet (4 mg total) by mouth every 6 (six) hours as needed for nausea.  20 tablet  1  . zolpidem (AMBIEN) 10 MG tablet TAKE 1 TABLET AT BEDTIME AS NEEDED FOR SLEEP.  30 tablet  2   No current facility-administered medications on file prior to visit.    Allergies  Allergen Reactions  . Bee Venom Anaphylaxis  . Penicillins Other (See Comments)    unknown    Family History  Problem Relation Age of Onset  . Lung cancer Mother   . Colon cancer      fathers side   . ADD / ADHD Son    BP 118/80  Pulse 82  Temp(Src) 99.1 F (37.3 C) (Oral)  Wt 195 lb (88.451 kg)  SpO2 97%  Review of Systems Denies headache.  He has low-grade temp.     Objective:   Physical Exam VITAL SIGNS:  See vs page GENERAL: no distress head: no deformity eyes: no periorbital swelling, no proptosis external nose and ears are normal mouth: no lesion seen Both tm's  are slightly red LUNGS:  Clear to auscultation     Assessment & Plan:  URI: new Insomnia: there is a potential rxn between cough syrup and ambien.

## 2013-12-03 ENCOUNTER — Encounter: Payer: Self-pay | Admitting: *Deleted

## 2013-12-04 ENCOUNTER — Encounter: Payer: Self-pay | Admitting: *Deleted

## 2013-12-04 ENCOUNTER — Telehealth: Payer: Self-pay | Admitting: Internal Medicine

## 2013-12-04 ENCOUNTER — Encounter: Payer: Self-pay | Admitting: Internal Medicine

## 2013-12-04 ENCOUNTER — Ambulatory Visit (INDEPENDENT_AMBULATORY_CARE_PROVIDER_SITE_OTHER): Payer: 59 | Admitting: Internal Medicine

## 2013-12-04 VITALS — BP 132/80 | Temp 98.8°F | Ht 72.25 in | Wt 192.0 lb

## 2013-12-04 DIAGNOSIS — M549 Dorsalgia, unspecified: Secondary | ICD-10-CM

## 2013-12-04 DIAGNOSIS — R030 Elevated blood-pressure reading, without diagnosis of hypertension: Secondary | ICD-10-CM

## 2013-12-04 DIAGNOSIS — G4700 Insomnia, unspecified: Secondary | ICD-10-CM

## 2013-12-04 NOTE — Patient Instructions (Signed)
Continue lifestyle intervention healthy eating and exercise . Preventive visit with labs in about 6 months

## 2013-12-04 NOTE — Telephone Encounter (Signed)
Pt would like to schedule his cpe at the same time as the twin boys, which is on 05/30/14.  Their appts are 1:45 and 2:30 .  Is it ok to sch pt in the 1:15 pm appt the same day? So all 3 will have their cpe that same day?

## 2013-12-04 NOTE — Progress Notes (Signed)
Pre visit review using our clinic review tool, if applicable. No additional management support is needed unless otherwise documented below in the visit note.   Chief Complaint  Patient presents with  . Follow-up    meds etc     HPI: Here with  Son for sons visit also   [id number]  On meds  Ok.    Still requiring and no sig se  With this .   No cv sx.   No bp readings  Except  In ov  Was 120/80    To see urologist soon about fu bladder cancer   Neck and back pain  Referral and doing exercises.   Seems that issues are postural and adapting without problem ROS: See pertinent positives and negatives per HPI. No cough cp sob syncope depression  Past Medical History  Diagnosis Date  . Insomnia     intermittent  . Colon polyps   . Bladder cancer   . Gross hematuria   . Gallbladder cholesterolosis intermittant  . Frequency of urination   . Urgency of urination   . Lower abdominal pain bladder area    Family History  Problem Relation Age of Onset  . Lung cancer Mother   . Colon cancer      fathers side   . ADD / ADHD Son     History   Social History  . Marital Status: Married    Spouse Name: N/A    Number of Children: N/A  . Years of Education: N/A   Social History Main Topics  . Smoking status: Former Smoker -- 1.00 packs/day for 10 years    Types: Cigarettes    Quit date: 10/24/2001  . Smokeless tobacco: Never Used  . Alcohol Use: 1.0 oz/week    2 drink(s) per week  . Drug Use: No  . Sexual Activity: None   Other Topics Concern  . None   Social History Narrative   hhof 5    Married    See centricity  EHR   No currently smoking.Marland Kitchen ex tobacco    Carpentry work    Outpatient Encounter Prescriptions as of 12/04/2013  Medication Sig  . cyclobenzaprine (FLEXERIL) 10 MG tablet TAKE 1 TABLET THREE TIMES DAILY AS NEEDED FOR MUSCLE SPASMS.  Marland Kitchen EPINEPHrine (EPIPEN) 0.3 mg/0.3 mL DEVI Inject 0.3 mLs (0.3 mg total) into the muscle once.  Marland Kitchen zolpidem (AMBIEN) 10 MG  tablet TAKE 1 TABLET AT BEDTIME AS NEEDED FOR SLEEP.  . [DISCONTINUED] azithromycin (ZITHROMAX) 500 MG tablet Take 1 tablet (500 mg total) by mouth daily.  . [DISCONTINUED] ondansetron (ZOFRAN) 4 MG tablet Take 1 tablet (4 mg total) by mouth every 6 (six) hours as needed for nausea.  . [DISCONTINUED] promethazine-codeine (PHENERGAN WITH CODEINE) 6.25-10 MG/5ML syrup Take 5 mLs by mouth every 4 (four) hours as needed for cough.    EXAM:  BP 132/80  Temp(Src) 98.8 F (37.1 C) (Oral)  Ht 6' 0.25" (1.835 m)  Wt 192 lb (87.091 kg)  BMI 25.86 kg/m2  Body mass index is 25.86 kg/(m^2). GENERAL: vitals reviewed and listed above, alert, oriented, appears well hydrated and in no acute distress HEENT: atraumatic, conjunctiva  clear, no obvious abnormalities on inspection of external nose and ears  NECK: no obvious masses on inspection palpation  LUNGS: clear to auscultation bilaterally, no wheezes, rales or rhonchi, good air movement CV: HRRR, no clubbing cyanosis or  peripheral edema nl cap refill  MS: moves all extremities without noticeable focal  abnormality  PSYCH: pleasant and cooperative, no obvious depression or anxiety  ASSESSMENT AND PLAN:  Discussed the following assessment and plan:  Insomnia, unspecified - med managment risk benefot disc continue contract to be signed   Borderline high blood pressure - check readings occassionally. to ensure control  Back pain - postural sm/pt evauation  Continue medication at this time . Benefit more than risk .  -Patient advised to return or notify health care team  if symptoms worsen or persist or new concerns arise. Of 6 months PV with labs   Patient Instructions  Continue lifestyle intervention healthy eating and exercise . Preventive visit with labs in about 6 months    Standley Brooking. Panosh M.D.

## 2013-12-05 NOTE — Telephone Encounter (Signed)
Ok

## 2013-12-20 ENCOUNTER — Other Ambulatory Visit: Payer: Self-pay | Admitting: Internal Medicine

## 2013-12-23 NOTE — Telephone Encounter (Signed)
Last filled on 10/14/13 #30 with an additional 2 refills Has a future CPE scheduled on 05/30/2014. Last seen on 12/04/5013. Please advise.  Thanks!

## 2013-12-24 ENCOUNTER — Telehealth: Payer: Self-pay | Admitting: *Deleted

## 2013-12-24 ENCOUNTER — Ambulatory Visit (INDEPENDENT_AMBULATORY_CARE_PROVIDER_SITE_OTHER)
Admission: RE | Admit: 2013-12-24 | Discharge: 2013-12-24 | Disposition: A | Discharge status: Home / Self Care | Payer: 59 | Source: Ambulatory Visit | Attending: Internal Medicine | Admitting: Internal Medicine

## 2013-12-24 ENCOUNTER — Other Ambulatory Visit: Payer: Self-pay | Admitting: Internal Medicine

## 2013-12-24 DIAGNOSIS — R0781 Pleurodynia: Secondary | ICD-10-CM

## 2013-12-24 DIAGNOSIS — R079 Chest pain, unspecified: Secondary | ICD-10-CM

## 2013-12-24 NOTE — Telephone Encounter (Signed)
Discussed pt with Dr. Regis Bill, she said pt can have Chest x-ray done today and see her tomorrow due to no appointments available today or can go to urgent care.   Went back to pt told him can order chest x-ray today and see her tomorrow or go to urgent care. Pt said he will go have Chest x-ray done today and see her tomorrow that way she has x-ray back. Appointment scheduled for tomorrow with Dr. Regis Bill at 4:00 pm. Told pt if pain gets severe or SOB needs to go to ED. Pt verbalizied understanding. Chest x-ray ordered in EPIC

## 2013-12-24 NOTE — Telephone Encounter (Signed)
Pt walked into office c/o woke up this morning with  pain left side of ribs radiating to back, worse when he takes a deep breath. Asked pt if having a cough? Pt stated not now had a cough and nausea this morning now gone. Pain is constant, Bp 140/90, pulse 64 , Oxygen Sat 98%. Pt is not in any distress. Told pt need to discuss with Dr. Regis Bill will be right back. Pt verbalized understanding.

## 2013-12-25 ENCOUNTER — Encounter: Payer: Self-pay | Admitting: Internal Medicine

## 2013-12-25 ENCOUNTER — Ambulatory Visit (INDEPENDENT_AMBULATORY_CARE_PROVIDER_SITE_OTHER): Payer: 59 | Admitting: Internal Medicine

## 2013-12-25 VITALS — BP 154/90 | Temp 98.6°F | Ht 72.25 in | Wt 193.0 lb

## 2013-12-25 DIAGNOSIS — C679 Malignant neoplasm of bladder, unspecified: Secondary | ICD-10-CM

## 2013-12-25 DIAGNOSIS — R079 Chest pain, unspecified: Secondary | ICD-10-CM

## 2013-12-25 NOTE — Progress Notes (Signed)
Chief Complaint  Patient presents with  . Follow-up    Chest x-ray. left side pain    HPI: Patient comes in today for SDA for  new problem evaluation. See yesterday came in to drop off daughter infromation and asked to make appt .  Onset side chest pain without fever minor cough   Got c xray and ov today with instruction to call on call service if progression alarm sx . After awakening yesterday am had onset of 7-8/10 left back side pain worse with breathing deep and bending over but no other sx. Since then getting better and now about 2/10 area left flank around to side chest and upper left q area.  No sob cough fever leg pain. No rx . To have urology check next week.  Neg hx of this pain.  ROS: See pertinent positives and negatives per HPI.no doe chest pain otherwise  hematuria uti sx .   Past Medical History  Diagnosis Date  . Insomnia     intermittent  . Colon polyps   . Bladder cancer   . Gross hematuria   . Gallbladder cholesterolosis intermittant  . Frequency of urination   . Urgency of urination   . Lower abdominal pain bladder area    Family History  Problem Relation Age of Onset  . Lung cancer Mother   . Colon cancer      fathers side   . ADD / ADHD Son     History   Social History  . Marital Status: Married    Spouse Name: N/A    Number of Children: N/A  . Years of Education: N/A   Social History Main Topics  . Smoking status: Former Smoker -- 1.00 packs/day for 10 years    Types: Cigarettes    Quit date: 10/24/2001  . Smokeless tobacco: Never Used  . Alcohol Use: 1.0 oz/week    2 drink(s) per week  . Drug Use: No  . Sexual Activity: None   Other Topics Concern  . None   Social History Narrative   hhof 5    Married    See centricity  EHR   No currently smoking.Marland Kitchen ex tobacco    Carpentry work    Outpatient Encounter Prescriptions as of 12/25/2013  Medication Sig  . cyclobenzaprine (FLEXERIL) 10 MG tablet TAKE 1 TABLET THREE TIMES DAILY AS  NEEDED FOR MUSCLE SPASMS.  Marland Kitchen EPINEPHrine (EPIPEN) 0.3 mg/0.3 mL DEVI Inject 0.3 mLs (0.3 mg total) into the muscle once.  Marland Kitchen zolpidem (AMBIEN) 10 MG tablet TAKE 1 TABLET AT BEDTIME AS NEEDED FOR SLEEP.    EXAM:  BP 154/90  Temp(Src) 98.6 F (37 C) (Oral)  Ht 6' 0.25" (1.835 m)  Wt 193 lb (87.544 kg)  BMI 26.00 kg/m2  SpO2 98%  Body mass index is 26 kg/(m^2).  GENERAL: vitals reviewed and listed above, alert, oriented, appears well hydrated and in no acute distress HEENT: atraumatic, conjunctiva  clear, no obvious abnormalities on inspection of external nose and ears  NECK: no obvious masses on inspection palpation  LUNGS: clear to auscultation bilaterally, no wheezes, rales or rhonchi, good air movement Chest wall no defomity and no point tenderness   Points to left flank and lateral areoung  Abdomen:  Sof,t normal bowel sounds without hepatosplenomegaly, no guarding rebound or masses CV: HRRR, no clubbing cyanosis or  peripheral edema nl cap refill  Skin no acute rashes  MS: moves all extremities without noticeable focal  Abnormality n o  sig edema PSYCH: pleasant and cooperative, no obvious depression or anxiety  ASSESSMENT AND PLAN:  Discussed the following assessment and plan:  Chest pain, unspecified - ? ms vs other  getting better  observe for zosterrash   call if so for rx . otherwise if recurring  Bladder cancer Uncertain if chest wall or other  abd  But getting better and x ray good. Observe  And fu if  persistent or progressive  Note to Urology  -Patient advised to return or notify health care team  if symptoms worsen or persist or new concerns arise.  Patient Instructions  Uncertain cause of pain but your exam is reassuring and chest x ray is normal . Call office if get a rash like shingles or  Recurring  Or shortness of breath .  Had urology get a urine tests when  You go and we will send a copy of note.    Standley Brooking. Panosh M.D.  Pre visit review using our  clinic review tool, if applicable. No additional management support is needed unless otherwise documented below in the visit note.

## 2013-12-25 NOTE — Telephone Encounter (Signed)
Ok to refill x 2  

## 2013-12-25 NOTE — Patient Instructions (Signed)
Uncertain cause of pain but your exam is reassuring and chest x ray is normal . Call office if get a rash like shingles or  Recurring  Or shortness of breath .  Had urology get a urine tests when  You go and we will send a copy of note.

## 2014-01-16 ENCOUNTER — Encounter: Payer: Self-pay | Admitting: Internal Medicine

## 2014-02-05 ENCOUNTER — Other Ambulatory Visit: Payer: Self-pay | Admitting: Urology

## 2014-02-17 ENCOUNTER — Encounter (HOSPITAL_BASED_OUTPATIENT_CLINIC_OR_DEPARTMENT_OTHER): Payer: Self-pay | Admitting: *Deleted

## 2014-02-18 ENCOUNTER — Encounter (HOSPITAL_BASED_OUTPATIENT_CLINIC_OR_DEPARTMENT_OTHER): Payer: Self-pay | Admitting: *Deleted

## 2014-02-18 NOTE — Progress Notes (Addendum)
Npo after mn. Arrive at [facility].  Needs istat 8 and ekg.

## 2014-02-19 ENCOUNTER — Encounter (HOSPITAL_BASED_OUTPATIENT_CLINIC_OR_DEPARTMENT_OTHER): Payer: Self-pay | Admitting: Anesthesiology

## 2014-02-19 ENCOUNTER — Other Ambulatory Visit: Payer: Self-pay

## 2014-02-19 ENCOUNTER — Ambulatory Visit (HOSPITAL_BASED_OUTPATIENT_CLINIC_OR_DEPARTMENT_OTHER): Payer: 59 | Admitting: Anesthesiology

## 2014-02-19 ENCOUNTER — Encounter (HOSPITAL_BASED_OUTPATIENT_CLINIC_OR_DEPARTMENT_OTHER)
Admission: RE | Disposition: A | Discharge status: Home / Self Care | Payer: Self-pay | Source: Ambulatory Visit | Attending: Urology

## 2014-02-19 ENCOUNTER — Ambulatory Visit (HOSPITAL_BASED_OUTPATIENT_CLINIC_OR_DEPARTMENT_OTHER)
Admission: RE | Admit: 2014-02-19 | Discharge: 2014-02-19 | Disposition: A | Discharge status: Home / Self Care | Payer: 59 | Source: Ambulatory Visit | Attending: Urology | Admitting: Urology

## 2014-02-19 ENCOUNTER — Encounter (HOSPITAL_BASED_OUTPATIENT_CLINIC_OR_DEPARTMENT_OTHER): Payer: 59 | Admitting: Anesthesiology

## 2014-02-19 ENCOUNTER — Telehealth: Payer: Self-pay | Admitting: Internal Medicine

## 2014-02-19 DIAGNOSIS — G4700 Insomnia, unspecified: Secondary | ICD-10-CM | POA: Insufficient documentation

## 2014-02-19 DIAGNOSIS — C679 Malignant neoplasm of bladder, unspecified: Secondary | ICD-10-CM | POA: Insufficient documentation

## 2014-02-19 DIAGNOSIS — K5730 Diverticulosis of large intestine without perforation or abscess without bleeding: Secondary | ICD-10-CM | POA: Insufficient documentation

## 2014-02-19 DIAGNOSIS — N400 Enlarged prostate without lower urinary tract symptoms: Secondary | ICD-10-CM | POA: Insufficient documentation

## 2014-02-19 DIAGNOSIS — Z860100 Personal history of colon polyps, unspecified: Secondary | ICD-10-CM | POA: Insufficient documentation

## 2014-02-19 DIAGNOSIS — Z87891 Personal history of nicotine dependence: Secondary | ICD-10-CM | POA: Insufficient documentation

## 2014-02-19 DIAGNOSIS — Z880 Allergy status to penicillin: Secondary | ICD-10-CM | POA: Insufficient documentation

## 2014-02-19 DIAGNOSIS — Z91038 Other insect allergy status: Secondary | ICD-10-CM | POA: Insufficient documentation

## 2014-02-19 HISTORY — DX: Diverticulosis of large intestine without perforation or abscess without bleeding: K57.30

## 2014-02-19 HISTORY — DX: Presence of spectacles and contact lenses: Z97.3

## 2014-02-19 HISTORY — DX: Calculus of gallbladder without cholecystitis without obstruction: K80.20

## 2014-02-19 HISTORY — DX: Elevated blood-pressure reading, without diagnosis of hypertension: R03.0

## 2014-02-19 HISTORY — DX: Unspecified symptoms and signs involving the genitourinary system: R39.9

## 2014-02-19 HISTORY — PX: CYSTOSCOPY W/ RETROGRADES: SHX1426

## 2014-02-19 HISTORY — DX: Personal history of colon polyps, unspecified: Z86.0100

## 2014-02-19 HISTORY — PX: TRANSURETHRAL RESECTION OF BLADDER TUMOR WITH GYRUS (TURBT-GYRUS): SHX6458

## 2014-02-19 HISTORY — DX: Personal history of colonic polyps: Z86.010

## 2014-02-19 HISTORY — DX: Benign prostatic hyperplasia without lower urinary tract symptoms: N40.0

## 2014-02-19 SURGERY — TRANSURETHRAL RESECTION OF BLADDER TUMOR WITH GYRUS (TURBT-GYRUS)
Anesthesia: General | Site: Bladder

## 2014-02-19 MED ORDER — PROPOFOL 10 MG/ML IV BOLUS
INTRAVENOUS | Status: DC | PRN
  Administered 2014-02-19: 200 mg via INTRAVENOUS
  Administered 2014-02-19: 40 mg via INTRAVENOUS

## 2014-02-19 MED ORDER — STERILE WATER FOR IRRIGATION IR SOLN
Status: DC | PRN
  Administered 2014-02-19: 500 mL

## 2014-02-19 MED ORDER — OXYCODONE-ACETAMINOPHEN 5-325 MG PO TABS
1.0000 | ORAL_TABLET | Freq: Four times a day (QID) | ORAL | Status: DC | PRN
  Administered 2014-02-19: 1 via ORAL
  Filled 2014-02-19: qty 2

## 2014-02-19 MED ORDER — GENTAMICIN IN SALINE 1.6-0.9 MG/ML-% IV SOLN
80.0000 mg | INTRAVENOUS | Status: AC
  Administered 2014-02-19: 80 mg via INTRAVENOUS
  Filled 2014-02-19: qty 50

## 2014-02-19 MED ORDER — SULFAMETHOXAZOLE-TMP DS 800-160 MG PO TABS
1.0000 | ORAL_TABLET | Freq: Two times a day (BID) | ORAL | Status: DC
Start: 2014-02-19 — End: 2014-05-30

## 2014-02-19 MED ORDER — PHENAZOPYRIDINE HCL 100 MG PO TABS: 100.0000 mg | ORAL_TABLET | Freq: Three times a day (TID) | ORAL | Status: DC | PRN

## 2014-02-19 MED ORDER — FENTANYL CITRATE 0.05 MG/ML IJ SOLN
INTRAMUSCULAR | Status: DC | PRN
  Administered 2014-02-19: 100 ug via INTRAVENOUS
  Administered 2014-02-19: 50 ug via INTRAVENOUS

## 2014-02-19 MED ORDER — MIDAZOLAM HCL 5 MG/5ML IJ SOLN
INTRAMUSCULAR | Status: DC | PRN
  Administered 2014-02-19: 2 mg via INTRAVENOUS

## 2014-02-19 MED ORDER — SENNOSIDES-DOCUSATE SODIUM 8.6-50 MG PO TABS: 1.0000 | ORAL_TABLET | Freq: Two times a day (BID) | ORAL | Status: DC

## 2014-02-19 MED ORDER — IOHEXOL 350 MG/ML SOLN
INTRAVENOUS | Status: DC | PRN
  Administered 2014-02-19: 7 mL

## 2014-02-19 MED ORDER — LACTATED RINGERS IV SOLN
INTRAVENOUS | Status: DC
  Administered 2014-02-19: 09:00:00 via INTRAVENOUS
  Filled 2014-02-19: qty 1000

## 2014-02-19 MED ORDER — FENTANYL CITRATE 0.05 MG/ML IJ SOLN
25.0000 ug | INTRAMUSCULAR | Status: DC | PRN
  Administered 2014-02-19: 50 ug via INTRAVENOUS
  Filled 2014-02-19: qty 1

## 2014-02-19 MED ORDER — OXYCODONE-ACETAMINOPHEN 5-325 MG PO TABS: 1.0000 | ORAL_TABLET | Freq: Four times a day (QID) | ORAL | Status: DC | PRN

## 2014-02-19 MED ORDER — PHENAZOPYRIDINE HCL 100 MG PO TABS
100.0000 mg | ORAL_TABLET | Freq: Three times a day (TID) | ORAL | Status: DC
  Administered 2014-02-19: 100 mg via ORAL
  Filled 2014-02-19: qty 1

## 2014-02-19 MED ORDER — FENTANYL CITRATE 0.05 MG/ML IJ SOLN
INTRAMUSCULAR | Status: AC
  Filled 2014-02-19: qty 2

## 2014-02-19 MED ORDER — SUCCINYLCHOLINE CHLORIDE 20 MG/ML IJ SOLN
INTRAMUSCULAR | Status: DC | PRN
  Administered 2014-02-19: 40 mg via INTRAVENOUS

## 2014-02-19 MED ORDER — LIDOCAINE HCL 1 % IJ SOLN
INTRAMUSCULAR | Status: DC | PRN
  Administered 2014-02-19: 50 mg via INTRADERMAL

## 2014-02-19 MED ORDER — MIDAZOLAM HCL 2 MG/2ML IJ SOLN
INTRAMUSCULAR | Status: AC
  Filled 2014-02-19: qty 2

## 2014-02-19 MED ORDER — FENTANYL CITRATE 0.05 MG/ML IJ SOLN
INTRAMUSCULAR | Status: AC
  Filled 2014-02-19: qty 6

## 2014-02-19 MED ORDER — PHENAZOPYRIDINE HCL 100 MG PO TABS
ORAL_TABLET | ORAL | Status: AC
  Filled 2014-02-19: qty 1

## 2014-02-19 MED ORDER — PROMETHAZINE HCL 25 MG/ML IJ SOLN
6.2500 mg | INTRAMUSCULAR | Status: DC | PRN
  Filled 2014-02-19: qty 1

## 2014-02-19 MED ORDER — OXYCODONE-ACETAMINOPHEN 5-325 MG PO TABS
ORAL_TABLET | ORAL | Status: AC
  Filled 2014-02-19: qty 1

## 2014-02-19 MED ORDER — SODIUM CHLORIDE 0.9 % IR SOLN
Status: DC | PRN
  Administered 2014-02-19: 6000 mL via INTRAVESICAL

## 2014-02-19 SURGICAL SUPPLY — 29 items
BAG URINE DRAINAGE (UROLOGICAL SUPPLIES) IMPLANT
BAG URINE LEG 19OZ MD ST LTX (BAG) IMPLANT
BAG URINE LEG 500ML (DRAIN) ×4 IMPLANT
BAG URO CATCHER STRL LF (DRAPE) ×4 IMPLANT
BASKET ZERO TIP NITINOL 2.4FR (BASKET) IMPLANT
CATH FOLEY 2W COUNCIL 20FR 5CC (CATHETERS) ×4 IMPLANT
CATH FOLEY 2WAY SLVR  5CC 22FR (CATHETERS)
CATH FOLEY 2WAY SLVR 30CC 20FR (CATHETERS) IMPLANT
CATH FOLEY 2WAY SLVR 5CC 22FR (CATHETERS) IMPLANT
CATH INTERMIT  6FR 70CM (CATHETERS) IMPLANT
CLOTH BEACON ORANGE TIMEOUT ST (SAFETY) ×4 IMPLANT
DRAPE CAMERA CLOSED 9X96 (DRAPES) ×4 IMPLANT
ELECT LOOP MED HF 24F 12D CBL (CLIP) ×4 IMPLANT
ELECT REM PT RETURN 9FT ADLT (ELECTROSURGICAL)
ELECT RESECT VAPORIZE 12D CBL (ELECTRODE) IMPLANT
ELECTRODE REM PT RTRN 9FT ADLT (ELECTROSURGICAL) IMPLANT
EVACUATOR MICROVAS BLADDER (UROLOGICAL SUPPLIES) IMPLANT
GLOVE BIO SURGEON STRL SZ 6.5 (GLOVE) ×3 IMPLANT
GLOVE BIO SURGEON STRL SZ7.5 (GLOVE) ×4 IMPLANT
GLOVE BIO SURGEONS STRL SZ 6.5 (GLOVE) ×1
GLOVE INDICATOR 6.5 STRL GRN (GLOVE) ×4 IMPLANT
GOWN STRL REUS W/ TWL LRG LVL3 (GOWN DISPOSABLE) ×2 IMPLANT
GOWN STRL REUS W/TWL LRG LVL3 (GOWN DISPOSABLE) ×2
GOWN STRL REUS W/TWL XL LVL3 (GOWN DISPOSABLE) ×4 IMPLANT
GUIDEWIRE ANG ZIPWIRE 038X150 (WIRE) IMPLANT
GUIDEWIRE STR DUAL SENSOR (WIRE) ×4 IMPLANT
IV NS IRRIG 3000ML ARTHROMATIC (IV SOLUTION) ×8 IMPLANT
PACK CYSTOSCOPY (CUSTOM PROCEDURE TRAY) ×4 IMPLANT
SYRINGE IRR TOOMEY STRL 70CC (SYRINGE) ×4 IMPLANT

## 2014-02-19 NOTE — Anesthesia Postprocedure Evaluation (Signed)
  Anesthesia Post-op Note  Patient: [name]  Procedure(s) Performed: Procedure(s) (LRB): TRANSURETHRAL RESECTION OF BLADDER TUMOR WITH GYRUS (TURBT-GYRUS) (N/A) CYSTOSCOPY WITH RETROGRADE PYELOGRAM (Bilateral)  Patient Location: PACU  Anesthesia Type: General  Level of Consciousness: awake and alert   Airway and Oxygen Therapy: Patient Spontanous Breathing  Post-op Pain: mild  Post-op Assessment: Post-op Vital signs reviewed, Patient's Cardiovascular Status Stable, Respiratory Function Stable, Patent Airway and No signs of Nausea or vomiting  Last Vitals:  Filed Vitals:   02/19/14 1211  BP: 133/93  Pulse: 59  Temp: 36.1 C  Resp: 16    Post-op Vital Signs: stable   Complications: No apparent anesthesia complications

## 2014-02-19 NOTE — Transfer of Care (Signed)
Immediate Anesthesia Transfer of Care Note  Patient: [name]  Procedure(s) Performed: Procedure(s): TRANSURETHRAL RESECTION OF BLADDER TUMOR WITH GYRUS (TURBT-GYRUS) (N/A) CYSTOSCOPY WITH RETROGRADE PYELOGRAM (Bilateral)  Patient Location: PACU  Anesthesia Type:General  Level of Consciousness: awake, alert , oriented and patient cooperative  Airway & Oxygen Therapy: Patient Spontanous Breathing and Patient connected to face mask oxygen  Post-op Assessment: Report given to PACU RN, Post -op Vital signs reviewed and stable and Patient moving all extremities  Post vital signs: Reviewed and stable  Complications: No apparent anesthesia complications

## 2014-02-19 NOTE — Telephone Encounter (Signed)
[facility] - [place], [place] - [address] Is requesting re-fill on zolpidem (AMBIEN) 10 MG tablet ° °

## 2014-02-19 NOTE — H&P (Signed)
[name] is an 53 y.o. male.    Chief Complaint: Pre-Op Transurethral Resection of Bladder TUmor  HPI:   1 - Low Grade Bladder Cancer - Found on w/u gross hematuria 2013. Remote 10PY smoker. No dye / chemical exposure.   Recent Treatment / Surveillance History: 10/03/2012 - TURBT + Left JJ stent for TaG1 tumor near left UO. CT Urogram + RPG's negative upper tracts. 02/2013 - NED; 05/2013 - NED (mild erythema at lateral edge of old resection site w/o papillary changes) 12/2013 Cysto multifocal recurrence including dome, posterior wall, and edge of old resection site (5cm total volume).   PMH sig for inguinal hernia repair as a child, acalculous cholecystitis. No CV diseas. No blood thinners.   Today "[name]" is seen to proceed with transurethral resection of his recurrent bladder cancer. Most recent UA without infections parameters.  Past Medical History  Diagnosis Date  . Bladder cancer     RECURRENT (HX  TURBT  2013)  . History of colon polyps   . Cholelithiasis     ASYMPTOMATIC  . Diverticulosis of colon   . Lower urinary tract symptoms (LUTS)   . BPH (benign prostatic hyperplasia)   . Insomnia   . Wears glasses   . Borderline hypertension     Past Surgical History  Procedure Laterality Date  . Tonsillectomy  as child  . Hernia repair  as child    right inguinal  . Transurethral resection of bladder tumor  10/03/2012    Procedure: TRANSURETHRAL RESECTION OF BLADDER TUMOR (TURBT);  Surgeon: Alexis Frock, MD;  Location: [facility];  Service: Urology;  Laterality: N/A;  . Cystoscopy with retrograde pyelogram, ureteroscopy and stent placement  10/03/2012    Procedure: [place], URETEROSCOPY AND STENT PLACEMENT;  Surgeon: Alexis Frock, MD;  Location: [facility];  Service: Urology;  Laterality: Left;    . Colonoscopy  2014    Family History  Problem Relation Age of Onset  . Lung cancer Mother   . Colon cancer       fathers side   . ADD / ADHD Son    Social History:  reports that he quit smoking about 12 years ago. His smoking use included Cigarettes. He has a 10 pack-year smoking history. He has never used smokeless tobacco. He reports that he drinks about one ounce of alcohol per week. He reports that he does not use illicit drugs.  Allergies:  Allergies  Allergen Reactions  . Bee Venom Anaphylaxis  . Penicillins Other (See Comments)    Unknown childhood allergy    No prescriptions prior to admission    No results found for this or any previous visit (from the past 36 hour(s)). No results found.  Review of Systems  Constitutional: Negative.  Negative for fever and chills.  HENT: Negative.   Eyes: Negative.   Respiratory: Negative.   Cardiovascular: Negative.   Gastrointestinal: Negative.   Genitourinary: Negative.  Negative for hematuria and flank pain.  Musculoskeletal: Negative.   Skin: Negative.   Neurological: Negative.   Endo/Heme/Allergies: Negative.   Psychiatric/Behavioral: Negative.     There were no vitals taken for this visit. Physical Exam  Constitutional: He is oriented to person, place, and time. He appears well-developed and well-nourished.  HENT:  Head: Normocephalic and atraumatic.  Eyes: EOM are normal. Pupils are equal, round, and reactive to light.  Neck: Normal range of motion. Neck supple.  Cardiovascular: Normal rate and regular rhythm.  Respiratory: Effort normal and breath sounds normal.  GI: Soft. Bowel sounds are normal.  Genitourinary: Penis normal.  No CVAT  Musculoskeletal: Normal range of motion.  Neurological: He is alert and oriented to person, place, and time.  Skin: Skin is warm and dry.  Psychiatric: He has a normal mood and affect. His behavior is normal. Judgment and thought content normal.     Assessment/Plan   1 - Bladder Cancer -  We rediscussed operative biopsy / transurethral resection as the best next step for diagnostic  and therapeutic purposes with goals being to remove all visible cancer and obtain tissue for pathologic exam. We rediscussed that for some low-grade tumors, this may be all the treatment required, but that for many other tumors such as high-grade lesions, further therapy including surgery and or chemotherapy may be warranted. We also reoutlined the fact that any bladder cancer diagnosis will require close follow-up with periodic upper and lower tract evaluation. We rediscussed risks including bleeding, infection, damage to kidney / ureter / bladder including bladder perforation which can typically managed with prolonged foley catheterization. We rementioned anesthetic and other rare risks including DVT, PE, MI, and mortality. I also mentioned that adjunctive procedures such as ureteral stenting, retrograde pyelography, and ureteroscopy may be necessary to fully evaluate the urinary tract depending on intra-operative findings. After answering all questions to the patient's satisfaction, they wish to proceed today as planned.   Alexis Frock 02/19/2014, 6:30 AM

## 2014-02-19 NOTE — Anesthesia Preprocedure Evaluation (Addendum)
Anesthesia Evaluation  Patient identified by MRN, date of birth, ID band Patient awake    Reviewed: Allergy & Precautions, H&P , NPO status , Patient's Chart, lab work & pertinent test results  Airway Mallampati: II TM Distance: >3 FB Neck ROM: Full    Dental no notable dental hx.    Pulmonary former smoker,  breath sounds clear to auscultation  Pulmonary exam normal       Cardiovascular negative cardio ROS  Rhythm:Regular Rate:Normal     Neuro/Psych Low back ache and chronic neck pain. negative psych ROS   GI/Hepatic negative GI ROS, Neg liver ROS,   Endo/Other  negative endocrine ROS  Renal/GU negative Renal ROS  negative genitourinary   Musculoskeletal negative musculoskeletal ROS (+)   Abdominal   Peds negative pediatric ROS (+)  Hematology negative hematology ROS (+)   Anesthesia Other Findings   Reproductive/Obstetrics negative OB ROS                          Anesthesia Physical Anesthesia Plan  ASA: II  Anesthesia Plan: General   Post-op Pain Management:    Induction: Intravenous  Airway Management Planned: LMA  Additional Equipment:   Intra-op Plan:   Post-operative Plan: Extubation in OR  Informed Consent: I have reviewed the patients History and Physical, chart, labs and discussed the procedure including the risks, benefits and alternatives for the proposed anesthesia with the patient or authorized representative who has indicated his/her understanding and acceptance.   Dental advisory given  Plan Discussed with: CRNA  Anesthesia Plan Comments:         Anesthesia Quick Evaluation

## 2014-02-19 NOTE — Discharge Instructions (Signed)
1 - You may have urinary urgency (bladder spasms) and bloody urine on / off for up to 2 weeks. This is normal.  2 - Call MD or go to ER for fever >102, severe pain / nausea / vomiting not relieved by medications, or acute change in medical status  CYSTOSCOPY HOME CARE INSTRUCTIONS  Activity: Rest for the remainder of the day.  Do not drive or operate equipment today.  You may resume normal activities in one to two days as instructed by your physician.   Meals: Drink plenty of liquids and eat light foods such as gelatin or soup this evening.  You may return to a normal meal plan tomorrow.  Return to Work: You may return to work in one to two days or as instructed by your physician.  Special Instructions / Symptoms: Call your physician if any of these symptoms occur:   -persistent or heavy bleeding  -bleeding which continues after first few urination  -large blood clots that are difficult to pass  -urine stream diminishes or stops completely  -fever equal to or higher than 101 degrees Farenheit.  -cloudy urine with a strong, foul odor  -severe pain  Females should always wipe from front to back after elimination.  You may feel some burning pain when you urinate.  This should disappear with time.  Applying moist heat to the lower abdomen or a hot tub bath may help relieve the pain. \  Follow-Up / Date of Return Visit to Your Physician:  *** Call for an appointment to arrange follow-up.  Patient Signature:  ________________________________________________________  Nurse's Signature:  ________________________________________________________  Indwelling Urinary Catheter Care You have been given a flexible tube (catheter) used to drain the bladder. Catheters are often used when a person has difficulty urinating due to blockage, bleeding, infection, or inability to control bladder or bowel movements (incontinence). A catheter requires daily care to prevent infection and blockage. HOME  CARE INSTRUCTIONS  Do the following to reduce the risk of infection. Antibiotic medicines cannot prevent infections. Limit the number of bacteria entering your bladder  Wash your hands for 2 minutes with soapy water before and after handling the catheter.  Wash your bottom and the entire catheter twice daily, as well as after each bowel movement. Wash the tip of the penis or just above the vaginal opening with soap and warm water, rinse, and then wash the rectal area. Always wash from front to back.  When changing from the leg bag to overnight bag or from the overnight bag to leg bag, thoroughly clean the end of the catheter where it connects to the tubing with an alcohol wipe.  Clean the leg bag and overnight bag daily after use. Replace your drainage bags weekly.  Always keep the tubing and bag below the level of your bladder. This allows your urine to drain properly. Lifting the bag or tubing above the level of your bladder will cause dirty urine to flow back into your bladder. If you must briefly lift the bag higher than your bladder, pinch the catheter or tubing to prevent backflow.  Drink enough water and fluids to keep your urine clear or pale yellow, or as directed by your caregiver. This will flush bacteria out of the bladder. Protect tissues from injury  Attach the catheter to your leg so there is no tension on the catheter. Use adhesive tape or a leg strap. If you are using adhesive tape, remove any sticky residue left behind by the previous tape  you used.  Place your leg bag on your lower leg. Fasten the straps securely and comfortably.  Do not remove the catheter yourself unless you have been instructed how to do so. Keep the urinary pathway open  Check throughout the day to be sure your catheter is working and urine is draining freely. Make sure the tubing does not become kinked.  Do not let the drainage bag overfill. SEEK IMMEDIATE MEDICAL CARE IF:   The catheter becomes  blocked. Urine is not draining.  Urine is leaking.  You have any pain.  You have a fever. Document Released: 10/10/2005 Document Revised: 09/26/2012 Document Reviewed: 03/11/2010 [facility] Patient Information 2014 Welcome.   Post Anesthesia Home Care Instructions  Activity: Get plenty of rest for the remainder of the day. A responsible adult should stay with you for 24 hours following the procedure.  For the next 24 hours, DO NOT: -Drive a car -Paediatric nurse -Drink alcoholic beverages -Take any medication unless instructed by your physician -Make any legal decisions or sign important papers.  Meals: Start with liquid foods such as gelatin or soup. Progress to regular foods as tolerated. Avoid greasy, spicy, heavy foods. If nausea and/or vomiting occur, drink only clear liquids until the nausea and/or vomiting subsides. Call your physician if vomiting continues.  Special Instructions/Symptoms: Your throat may feel dry or sore from the anesthesia or the breathing tube placed in your throat during surgery. If this causes discomfort, gargle with warm salt water. The discomfort should disappear within 24 hours.

## 2014-02-19 NOTE — Brief Op Note (Signed)
02/19/2014  10:23 AM  PATIENT:  Bernestine Amass  53 y.o. male  PRE-OPERATIVE DIAGNOSIS:  RECURRENT BLADDER CANCER  POST-OPERATIVE DIAGNOSIS:  RECURRENT BLADDER CANCER  PROCEDURE:  Procedure(s): TRANSURETHRAL RESECTION OF BLADDER TUMOR WITH GYRUS (TURBT-GYRUS) (N/A) CYSTOSCOPY WITH RETROGRADE PYELOGRAM (Bilateral)  SURGEON:  Surgeon(s) and Role:    * Alexis Frock, MD - Primary  PHYSICIAN ASSISTANT:   ASSISTANTS: none   ANESTHESIA:   general  EBL:     BLOOD ADMINISTERED:none  DRAINS: 58F foley to straight drain   LOCAL MEDICATIONS USED:  NONE  SPECIMEN:  Source of Specimen:  1 - bladder tumor, 2 - base of bladder tumor  DISPOSITION OF SPECIMEN:  PATHOLOGY  COUNTS:  YES  TOURNIQUET:  * No tourniquets in log *  DICTATION: .Other Dictation: Dictation Number [id number]  PLAN OF CARE: Discharge to home after PACU  PATIENT DISPOSITION:  PACU - hemodynamically stable.   Delay start of Pharmacological VTE agent (>24hrs) due to surgical blood loss or risk of bleeding: not applicable

## 2014-02-20 LAB — POCT I-STAT, CHEM 8
BUN: 23 mg/dL (ref 6–23)
CREATININE: 0.9 mg/dL (ref 0.50–1.35)
Calcium, Ion: 1.28 mmol/L — ABNORMAL HIGH (ref 1.12–1.23)
Chloride: 105 mEq/L (ref 96–112)
Glucose, Bld: 105 mg/dL — ABNORMAL HIGH (ref 70–99)
HCT: 44 % (ref 39.0–52.0)
Hemoglobin: 15 g/dL (ref 13.0–17.0)
POTASSIUM: 3.8 meq/L (ref 3.7–5.3)
SODIUM: 142 meq/L (ref 137–147)
TCO2: 26 mmol/L (ref 0–100)

## 2014-02-20 MED ORDER — ZOLPIDEM TARTRATE 10 MG PO TABS: ORAL_TABLET | ORAL | Status: DC

## 2014-02-20 NOTE — Telephone Encounter (Signed)
Ok to refill x 3 months??       

## 2014-02-20 NOTE — Telephone Encounter (Signed)
Called to the pharmacy and left on voicemail. 

## 2014-02-21 NOTE — Op Note (Signed)
NAME:  [name], [name]                     ACCOUNT NO.:  MEDICAL RECORD NO.:  [id number]  LOCATION:                                 FACILITY:  PHYSICIAN:  Alexis Frock, MD     DATE OF BIRTH:  03/03/1961  DATE OF PROCEDURE:  02/19/2014 DATE OF DISCHARGE:                              OPERATIVE REPORT   DIAGNOSIS:  Recurrent bladder cancer.  PROCEDURE: 1. Transurethral resection of bladder tumor, volume large. 2. Bilateral retrograde pyelogram interpretation.  ESTIMATED BLOOD LOSS:  Nil.  COMPLICATIONS:  None.  SPECIMENS: 1. Bladder tumor. 2. Base of bladder tumor.  FINDINGS: 1. Unremarkable bilateral retrograde pyelograms. 2. Sparing of the left ureteral orifice with bladder tumor resection,     excellent efflux of urine post resection without damage to orifice     itself. 3. Multifocal recurrent papillary bladder tumor and multiple foci at     the dome and posterior as well as foci area of prior resection site     on the left wall just lateral to the left ureteral orifice. Total tumor volume 6cm2.  DRAINS: 1. 20-French Foley catheter straight drain.  INDICATION:  Mr. Passow is a pleasant 53 year old gentleman with history of low-grade bladder cancer status post transurethral resection. Previously, he was found on cystoscopic surveillance to have multifocal recurrence including new papillary tumors at its dome, posterior wall, and left lateral bladder.  Options were discussed including transurethral resection and he wished to proceed.  Informed consent was obtained and placed in the medical record.  PROCEDURE IN DETAIL:  The patient being [name] was verified.  Procedure being transurethral resection of bladder tumor was confirmed.  Procedure was carried out.  Time-out was performed. Intravenous antibiotics were administered.  General LMA anesthesia was introduced.  The patient was placed into a low lithotomy position and a sterile field was created  prepping and draping the patient's penis, perineum, and proximal thighs using iodine x3.  Next, cystourethroscopy was performed using a 22-French rigid scope with 12-degree offset lens. Inspection of the anterior and posterior urethra unremarkable. Inspection of the urinary bladder revealed multifocal papillary bladder tumor with several foci at the dome of the posterior wall.  Additional foci in the left lateral wall close to previous old resection site. Ureteral orifices were in the normal anatomic position.  Attention was then directed to bilateral retrograde pyelogram, first on the right side.  The right ureteral orifice was cannulated with a 6-French catheter and right retrograde pyelogram was obtained. Right retrograde pyelogram demonstrated single right ureter, single system right kidney.  No filling defects or narrowing noted.  Similarly, left retrograde pyelogram was obtained.  Left retrograde pyelogram demonstrated single left ureter, single system left kidney.  No filling defects or narrowing noted.  Next, the cystoscope was exchanged for a 20-French ACMI continuous flow resectoscope sheath with medium loop.  The settings were placed at 120 cut and 100 lag and very careful resection was performed of the dome and posterior tumors counter resection down what appeared to be the superficial and fibromuscular layer of the urinary bladder.  Great care was taken to avoid perforation and this did  not occur.  The base and edges of this were fulgurated.  Attention was then directed at the left wall resection.  Succinylcholine was given to blunt obturator reflex and after fasciculation, deliberate resection was performed of the previous old resection site and papillary tumor in the left wall carrying it down to what was felt to be the midportion of the fibromuscular stroma of the bladder taking exclusive care to avoid damage down directly to the left ureteral orifice, this did not occur.   All these fragments set aside, labeled bladder tumor.  Next, cold cup biopsy forceps were used to obtain deeper seromuscular bites of the left wall site, these were set aside and labeled base of bladder tumor.  Additional coagulation current was applied to the resection sites and to the base of the deep bladder biopsy.  There was no evidence of bladder perforation.  Bladder was somewhat thinned on the left wall as expected and it was felt that an interval Foley catheter drainage was warranted.  The left ureteral orifice was inspected and uninjured and efflux of urine was seen.  It was felt that stenting was not warranted.  As such, the resectoscope was exchanged for a 20-French Foley catheter, 10 mL sterile water in the balloon to straight drain.  Efflux was clear.  Procedure then terminated.  The patient tolerated the procedure well.  There were no immediate periprocedural complications.  The patient was taken to the postanesthesia care unit in a stable condition.          ______________________________ Alexis Frock, MD     TM/MEDQ  D:  02/19/2014  T:  02/19/2014  Job:  [id number]

## 2014-02-24 ENCOUNTER — Encounter (HOSPITAL_BASED_OUTPATIENT_CLINIC_OR_DEPARTMENT_OTHER): Payer: Self-pay | Admitting: Urology

## 2014-03-07 ENCOUNTER — Encounter (HOSPITAL_BASED_OUTPATIENT_CLINIC_OR_DEPARTMENT_OTHER): Payer: Self-pay | Admitting: Urology

## 2014-04-25 IMAGING — CR DG CHEST 2V
2 series · 2 of 2 positions shown · non-contrast
Comparison: 09/28/2012

CLINICAL DATA: Left chest pain

EXAM:
CHEST - 2 VIEW

[view not recorded (1 of 2)]
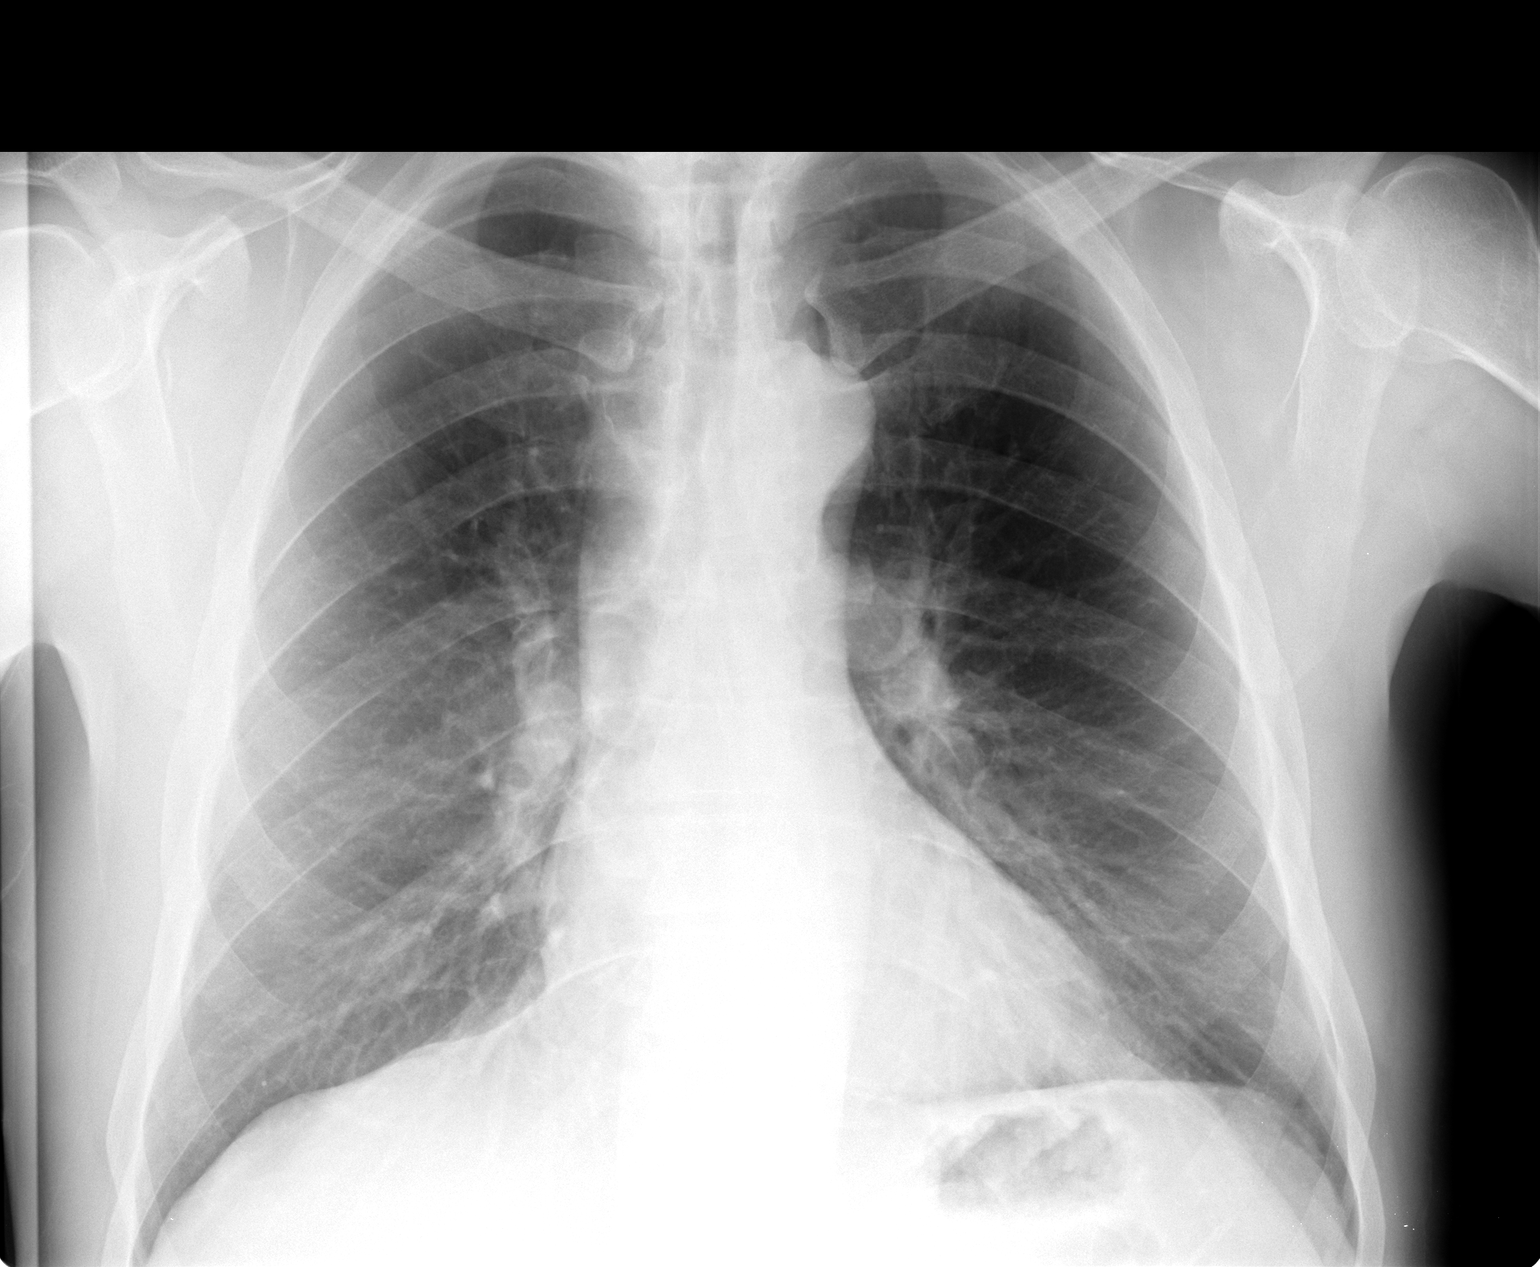

[view not recorded (2 of 2)]
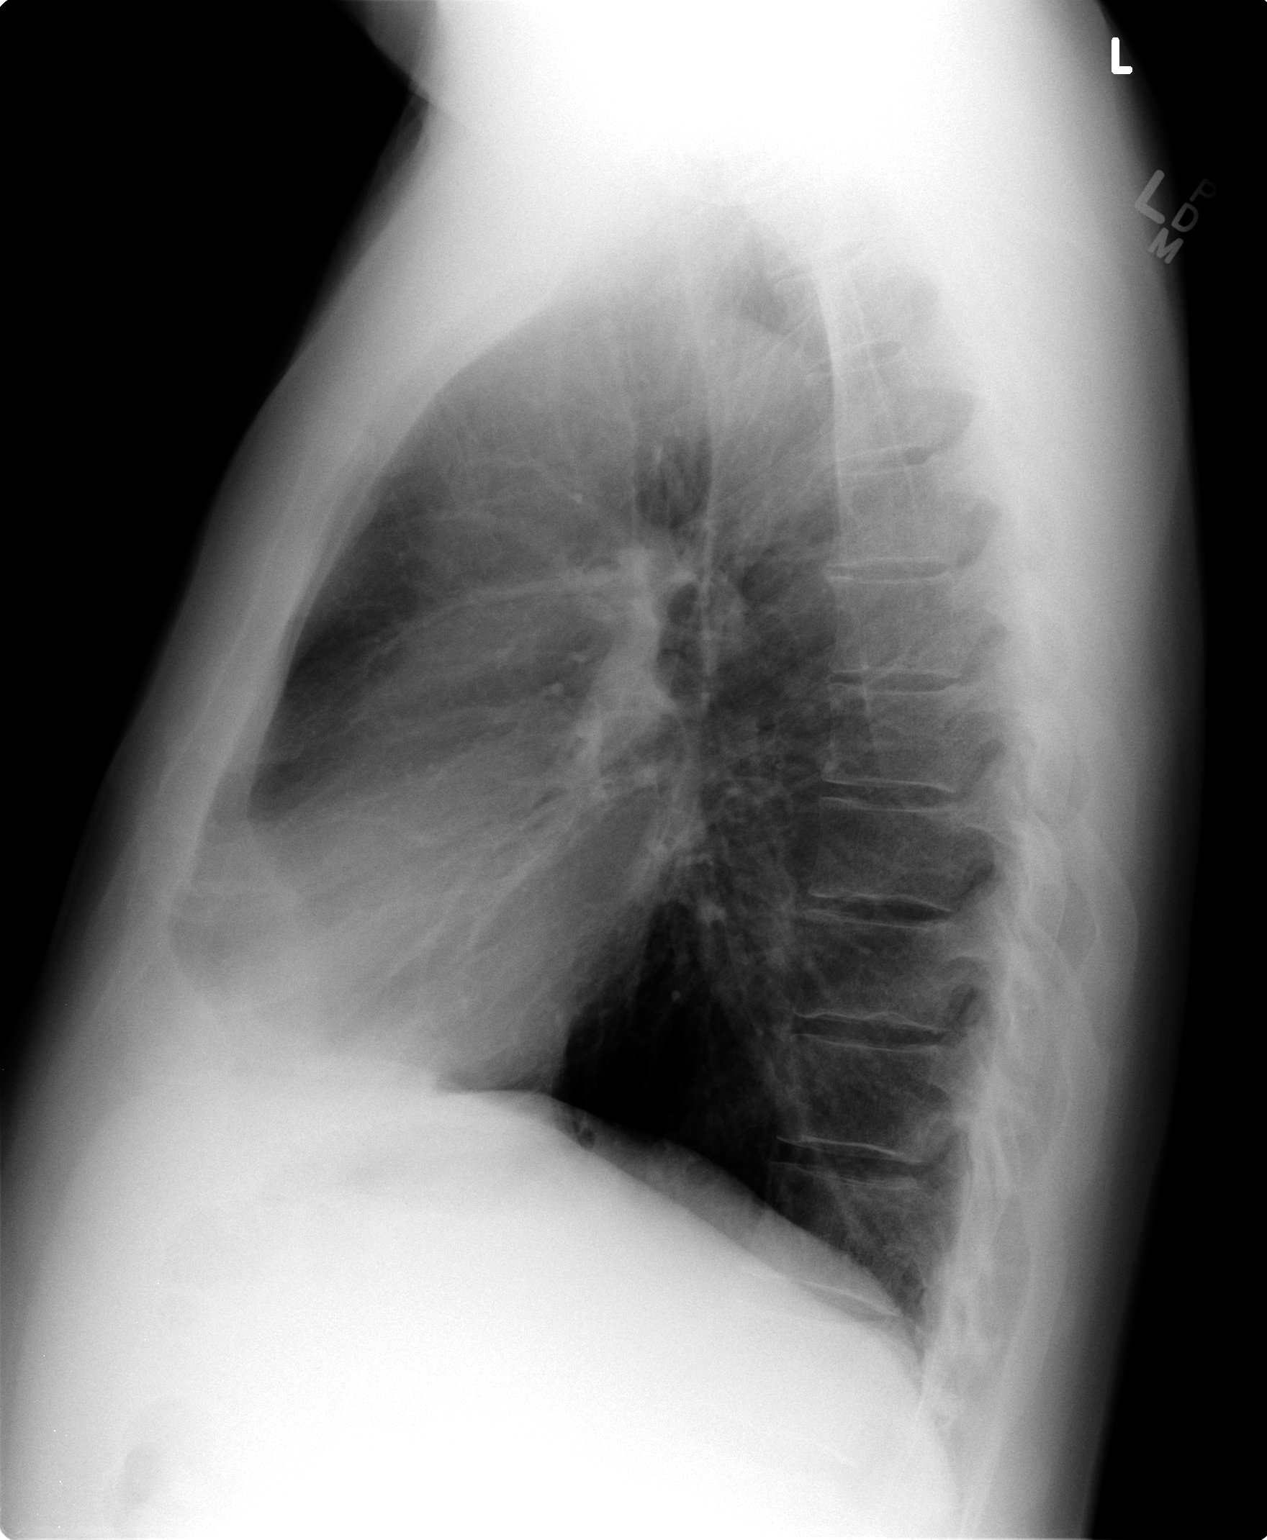

[2 of 2 positions shown; findings below may reference images not displayed]

FINDINGS: Lungs are clear. Heart size and mediastinal contours are within
normal limits.
No effusion.  No pneumothorax.
Visualized skeletal structures are unremarkable.
IMPRESSION: No acute cardiopulmonary disease.

## 2014-05-13 ENCOUNTER — Telehealth: Payer: Self-pay | Admitting: Internal Medicine

## 2014-05-13 ENCOUNTER — Other Ambulatory Visit: Payer: Self-pay | Admitting: Urology

## 2014-05-13 MED ORDER — ZOLPIDEM TARTRATE 10 MG PO TABS: ORAL_TABLET | ORAL | Status: DC

## 2014-05-13 NOTE — Telephone Encounter (Signed)
Called to the pharmacy and left on voicemail. 

## 2014-05-13 NOTE — Telephone Encounter (Signed)
Ok x 90 days  

## 2014-05-13 NOTE — Telephone Encounter (Signed)
GATE Kelso, Edna RD. Is requesting re-fill on zolpidem (AMBIEN) 10 MG tablet

## 2014-05-28 ENCOUNTER — Other Ambulatory Visit: Payer: 59

## 2014-05-28 ENCOUNTER — Other Ambulatory Visit (INDEPENDENT_AMBULATORY_CARE_PROVIDER_SITE_OTHER): Payer: Self-pay

## 2014-05-28 DIAGNOSIS — Z0000 Encounter for general adult medical examination without abnormal findings: Secondary | ICD-10-CM

## 2014-05-28 LAB — CBC WITH DIFFERENTIAL/PLATELET
BASOS PCT: 0.2 % (ref 0.0–3.0)
Basophils Absolute: 0 10*3/uL (ref 0.0–0.1)
EOS ABS: 0.2 10*3/uL (ref 0.0–0.7)
Eosinophils Relative: 2.7 % (ref 0.0–5.0)
HEMATOCRIT: 43.6 % (ref 39.0–52.0)
HEMOGLOBIN: 14.8 g/dL (ref 13.0–17.0)
LYMPHS ABS: 2.3 10*3/uL (ref 0.7–4.0)
Lymphocytes Relative: 28.5 % (ref 12.0–46.0)
MCHC: 34 g/dL (ref 30.0–36.0)
MCV: 92.8 fl (ref 78.0–100.0)
MONO ABS: 0.7 10*3/uL (ref 0.1–1.0)
Monocytes Relative: 8.3 % (ref 3.0–12.0)
Neutro Abs: 5 10*3/uL (ref 1.4–7.7)
Neutrophils Relative %: 60.3 % (ref 43.0–77.0)
Platelets: 219 10*3/uL (ref 150.0–400.0)
RBC: 4.7 Mil/uL (ref 4.22–5.81)
RDW: 13.9 % (ref 11.5–15.5)
WBC: 8.2 10*3/uL (ref 4.0–10.5)

## 2014-05-28 LAB — LIPID PANEL
CHOLESTEROL: 162 mg/dL (ref 0–200)
HDL: 41.5 mg/dL (ref >39.00)
LDL Cholesterol: 107 mg/dL — ABNORMAL HIGH (ref 0–99)
NONHDL: 120.5
Total CHOL/HDL Ratio: 4
Triglycerides: 69 mg/dL (ref 0.0–149.0)
VLDL: 13.8 mg/dL (ref 0.0–40.0)

## 2014-05-28 LAB — PSA: PSA: 1.69 ng/mL (ref 0.10–4.00)

## 2014-05-28 LAB — BASIC METABOLIC PANEL
BUN: 17 mg/dL (ref 6–23)
CO2: 25 mEq/L (ref 19–32)
Calcium: 9.4 mg/dL (ref 8.4–10.5)
Chloride: 101 mEq/L (ref 96–112)
Creatinine, Ser: 0.9 mg/dL (ref 0.4–1.5)
GFR: 95.98 mL/min (ref >60.00)
Glucose, Bld: 95 mg/dL (ref 70–99)
Potassium: 3.7 mEq/L (ref 3.5–5.1)
Sodium: 137 mEq/L (ref 135–145)

## 2014-05-28 LAB — HEPATIC FUNCTION PANEL
ALBUMIN: 4.4 g/dL (ref 3.5–5.2)
ALK PHOS: 65 U/L (ref 39–117)
ALT: 28 U/L (ref 0–53)
AST: 20 U/L (ref 0–37)
Bilirubin, Direct: 0.2 mg/dL (ref 0.0–0.3)
Total Bilirubin: 1 mg/dL (ref 0.2–1.2)
Total Protein: 7 g/dL (ref 6.0–8.3)

## 2014-05-28 LAB — TSH: TSH: 4.27 u[IU]/mL (ref 0.35–4.50)

## 2014-05-30 ENCOUNTER — Encounter: Payer: Self-pay | Admitting: Internal Medicine

## 2014-05-30 ENCOUNTER — Ambulatory Visit (INDEPENDENT_AMBULATORY_CARE_PROVIDER_SITE_OTHER): Payer: Self-pay | Admitting: Internal Medicine

## 2014-05-30 ENCOUNTER — Telehealth: Payer: Self-pay | Admitting: Internal Medicine

## 2014-05-30 VITALS — BP 140/88 | Temp 98.4°F | Ht 72.5 in | Wt 196.0 lb

## 2014-05-30 DIAGNOSIS — Z79899 Other long term (current) drug therapy: Secondary | ICD-10-CM | POA: Insufficient documentation

## 2014-05-30 DIAGNOSIS — G8929 Other chronic pain: Secondary | ICD-10-CM

## 2014-05-30 DIAGNOSIS — M542 Cervicalgia: Secondary | ICD-10-CM

## 2014-05-30 DIAGNOSIS — Z0000 Encounter for general adult medical examination without abnormal findings: Secondary | ICD-10-CM

## 2014-05-30 DIAGNOSIS — C679 Malignant neoplasm of bladder, unspecified: Secondary | ICD-10-CM

## 2014-05-30 DIAGNOSIS — G4700 Insomnia, unspecified: Secondary | ICD-10-CM

## 2014-05-30 DIAGNOSIS — R030 Elevated blood-pressure reading, without diagnosis of hypertension: Secondary | ICD-10-CM

## 2014-05-30 MED ORDER — CYCLOBENZAPRINE HCL 10 MG PO TABS: ORAL_TABLET | ORAL | Status: DC

## 2014-05-30 NOTE — Telephone Encounter (Signed)
Yes

## 2014-05-30 NOTE — Assessment & Plan Note (Signed)
Refill flexeril forprn use

## 2014-05-30 NOTE — Progress Notes (Signed)
Pre visit review using our clinic review tool, if applicable. No additional management support is needed unless otherwise documented below in the visit note.   Chief Complaint  Patient presents with  . Annual Exam    HPI: Patient comes in today for [place] visit  ; med refill assessment for Lorrin Mais Since his last visit no major changes except related to his bladder cancer. He is to go back for procedure August 26th  Resectioning.  Now high grade non invasive  Bladder cancer.  Otherwise has been healthy trying did better exercises.  In regard to sleep he still uses the Ambien without significant side effect no other sleep disorder with this. 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 Has some neck pain off-and-on and use Flexeril as needed not very often. Blood pressure tends to run high normal or normal when he goes to the urologist office of course it is up. He has a wrist cuff and when he takes it seems to be okay.  Health Maintenance  Topic Date Due  . Influenza Vaccine  05/24/2014  . Colonoscopy  08/22/2018  . Tetanus/tdap  08/04/2020  LIFESTYLE:  Exercise:  Yes  Tobacco/ETS:no Alcohol: 6/week Sugar beverages:no to rare Sleep: on meds  Drug use: no Colonoscopy: utd Health Maintenance Review  ROS:  GEN/ HEENT: No fever, significant weight changes sweats headaches vision problems glasses hearing changes, CV/ PULM; No chest pain shortness of breath cough, syncope,edema  change in exercise tolerance. GI /GU: No adominal pain, vomiting, change in bowel habits. No blood in the stool. No significant GU symptoms. SKIN/HEME: ,no acute skin rashes suspicious lesions or bleeding. No lymphadenopathy, nodules, masses.  NEURO/ PSYCH:  No neurologic signs such as weakness numbness. No depression anxiety. IMM/ Allergy: No unusual infections.  Allergy .   REST of 12 system review negative except as per HPI   Past Medical History  Diagnosis Date  . Bladder cancer    RECURRENT (HX  TURBT  2013)  . History of colon polyps   . Cholelithiasis     ASYMPTOMATIC  . Diverticulosis of colon   . Lower urinary tract symptoms (LUTS)   . BPH (benign prostatic hyperplasia)   . Insomnia   . Wears glasses   . Borderline hypertension     Family History  Problem Relation Age of Onset  . Lung cancer Mother   . Colon cancer      fathers side   . ADD / ADHD Son     History   Social History  . Marital Status: Married    Spouse Name: N/A    Number of Children: N/A  . Years of Education: N/A   Social History Main Topics  . Smoking status: Former Smoker -- 1.00 packs/day for 10 years    Types: Cigarettes    Quit date: 10/24/2001  . Smokeless tobacco: Never Used  . Alcohol Use: 1.0 oz/week    2 drink(s) per week  . Drug Use: No  . Sexual Activity: None   Other Topics Concern  . 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 cyclobenzaprine (FLEXERIL) 10 MG tablet TAKE 1 TABLET THREE TIMES DAILY AS NEEDED FOR MUSCLE SPASMS.  Marland Kitchen EPINEPHrine (EPIPEN) 0.3 mg/0.3 mL  DEVI Inject 0.3 mLs (0.3 mg total) into the muscle once.  Marland Kitchen zolpidem (AMBIEN) 10 MG tablet TAKE 1 TABLET AT BEDTIME AS NEEDED FOR SLEEP.  . [DISCONTINUED] cyclobenzaprine (FLEXERIL) 10 MG tablet TAKE 1 TABLET THREE TIMES DAILY AS NEEDED FOR MUSCLE SPASMS.  . [DISCONTINUED] oxyCODONE-acetaminophen (ROXICET) 5-325 MG per tablet Take 1-2 tablets by mouth every 6 (six) hours as needed for moderate pain or severe pain. Post-operatively  . [DISCONTINUED] phenazopyridine (PYRIDIUM) 100 MG tablet Take 1 tablet (100 mg total) by mouth 3 (three) times daily as needed for pain. (urinary burning and urgency, this will turn urine orange-colored)  . [DISCONTINUED] senna-docusate (SENOKOT-S) 8.6-50 MG per tablet Take 1 tablet by mouth 2  (two) times daily. While taking pain meds to prevent constipation  . [DISCONTINUED] sulfamethoxazole-trimethoprim (BACTRIM DS) 800-160 MG per tablet Take 1 tablet by mouth 2 (two) times daily. X 3 days to prevent infection    EXAM:  BP 140/88  Temp(Src) 98.4 F (36.9 C) (Oral)  Ht 6' 0.5" (1.842 m)  Wt 196 lb (88.905 kg)  BMI 26.20 kg/m2  Body mass index is 26.2 kg/(m^2). Repeat bp left 142/90 Physical Exam: Vital signs reviewed DUK:GURK is a well-developed well-nourished alert cooperative    who appearsr stated age in no acute distress.  HEENT: normocephalic atraumatic , Eyes: PERRL EOM's full, conjunctiva clear, Nares: paten,t no deformity discharge or tenderness., Ears: no deformity EAC's clear TMs with normal landmarks. Mouth: clear OP, no lesions, edema.  Moist mucous membranes. Dentition in adequate repair. NECK: supple without masses, thyromegaly or bruits. CHEST/PULM:  Clear to auscultation and percussion breath sounds equal no wheeze , rales or rhonchi. No chest wall deformities or tenderness. CV: PMI is nondisplaced, S1 S2 no gallops, murmurs, rubs. Peripheral pulses are full without delay.No JVD .  ABDOMEN: Bowel sounds normal nontender  No guard or rebound, no hepato splenomegal no CVA tenderness.   Extremtities:  No clubbing cyanosis or edema, no acute joint swelling or redness no focal atrophy NEURO:  Oriented x3, cranial nerves 3-12 appear to be intact, no obvious focal weakness,gait within normal limits no abnormal reflexes or asymmetrical SKIN: No acute rashes normal turgor, color, no bruising or petechiae. PSYCH: Oriented, good eye contact, no obvious depression anxiety, cognition and judgment appear normal. LN: no cervical axillary inguinal adenopathy  Lab Results  Component Value Date   WBC 8.2 05/28/2014   HGB 14.8 05/28/2014   HCT 43.6 05/28/2014   PLT 219.0 05/28/2014   GLUCOSE 95 05/28/2014   CHOL 162 05/28/2014   TRIG 69.0 05/28/2014   HDL 41.50 05/28/2014   LDLDIRECT  120.3 07/21/2008   LDLCALC 107* 05/28/2014   ALT 28 05/28/2014   AST 20 05/28/2014   NA 137 05/28/2014   K 3.7 05/28/2014   CL 101 05/28/2014   CREATININE 0.9 05/28/2014   BUN 17 05/28/2014   CO2 25 05/28/2014   TSH 4.27 05/28/2014   PSA 1.69 05/28/2014   HGBA1C 5.1 11/20/2012    ASSESSMENT AND PLAN:  Discussed the following assessment and plan:  Visit for preventive health examination - utd  lipids better  continue lsi  Insomnia, unspecified  Borderline high blood pressure - better on recheck  monitor readings at home and send in  Malignant neoplasm of urinary bladder, unspecified site  Medication management - benefit more than risk at this time  Lorrin Mais  pt aware  Chronic neck pain  Patient Care Team: Burnis Medin, MD as PCP - General Alexis Frock,  MD as Attending Physician (Urology) Patient Instructions  Take blood pressure readings twice a day for 7- 10 days and then periodically .To ensure below 140/90   .Send in readings      Continue lifestyle intervention healthy eating and exercise . Healthy lifestyle includes : At least 150 minutes of exercise weeks  , weight at healthy levels, which is usually   BMI 19-25. Avoid trans fats and processed foods;  Increase fresh fruits and veges to 5 servings per day. And avoid sweet beverages including tea and juice. Mediterranean diet with olive oil and nuts have been noted to be heart and brain healthy . Avoid tobacco products . Limit  alcohol to  7 per week for women and 14 servings for men.  Get adequate sleep .     Standley Brooking. Panosh M.D.

## 2014-05-30 NOTE — Telephone Encounter (Signed)
Pt would like to sch his cpx and both kids wcc on same day in 2016. Can I [place]?

## 2014-05-30 NOTE — Patient Instructions (Signed)
Take blood pressure readings twice a day for 7- 10 days and then periodically .To ensure below 140/90   .Send in readings      Continue lifestyle intervention healthy eating and exercise . Healthy lifestyle includes : At least 150 minutes of exercise weeks  , weight at healthy levels, which is usually   BMI 19-25. Avoid trans fats and processed foods;  Increase fresh fruits and veges to 5 servings per day. And avoid sweet beverages including tea and juice. Mediterranean diet with olive oil and nuts have been noted to be heart and brain healthy . Avoid tobacco products . Limit  alcohol to  7 per week for women and 14 servings for men.  Get adequate sleep .

## 2014-06-04 ENCOUNTER — Encounter: Payer: 59 | Admitting: Internal Medicine

## 2014-06-06 NOTE — Telephone Encounter (Signed)
lmom for pt to cb

## 2014-06-16 ENCOUNTER — Encounter (HOSPITAL_BASED_OUTPATIENT_CLINIC_OR_DEPARTMENT_OTHER): Payer: Self-pay | Admitting: *Deleted

## 2014-06-16 NOTE — Progress Notes (Signed)
NPO AFTER MN. ARRIVE AT 1100. CURRENT LABS RESULTS AND EKG IN CHART AND EPIC.

## 2014-06-18 ENCOUNTER — Encounter (HOSPITAL_BASED_OUTPATIENT_CLINIC_OR_DEPARTMENT_OTHER): Payer: 59 | Admitting: Anesthesiology

## 2014-06-18 ENCOUNTER — Encounter (HOSPITAL_BASED_OUTPATIENT_CLINIC_OR_DEPARTMENT_OTHER): Payer: Self-pay | Admitting: Anesthesiology

## 2014-06-18 ENCOUNTER — Ambulatory Visit (HOSPITAL_BASED_OUTPATIENT_CLINIC_OR_DEPARTMENT_OTHER)
Admission: RE | Admit: 2014-06-18 | Discharge: 2014-06-18 | Disposition: A | Discharge status: Home / Self Care | Payer: 59 | Source: Ambulatory Visit | Attending: Urology | Admitting: Urology

## 2014-06-18 ENCOUNTER — Encounter (HOSPITAL_BASED_OUTPATIENT_CLINIC_OR_DEPARTMENT_OTHER)
Admission: RE | Disposition: A | Discharge status: Home / Self Care | Payer: Self-pay | Source: Ambulatory Visit | Attending: Urology

## 2014-06-18 ENCOUNTER — Ambulatory Visit (HOSPITAL_BASED_OUTPATIENT_CLINIC_OR_DEPARTMENT_OTHER): Payer: 59 | Admitting: Anesthesiology

## 2014-06-18 DIAGNOSIS — Z880 Allergy status to penicillin: Secondary | ICD-10-CM | POA: Diagnosis not present

## 2014-06-18 DIAGNOSIS — C679 Malignant neoplasm of bladder, unspecified: Secondary | ICD-10-CM | POA: Insufficient documentation

## 2014-06-18 DIAGNOSIS — Z87891 Personal history of nicotine dependence: Secondary | ICD-10-CM | POA: Insufficient documentation

## 2014-06-18 DIAGNOSIS — Z860100 Personal history of colon polyps, unspecified: Secondary | ICD-10-CM | POA: Insufficient documentation

## 2014-06-18 DIAGNOSIS — N400 Enlarged prostate without lower urinary tract symptoms: Secondary | ICD-10-CM | POA: Insufficient documentation

## 2014-06-18 DIAGNOSIS — Z91038 Other insect allergy status: Secondary | ICD-10-CM | POA: Insufficient documentation

## 2014-06-18 HISTORY — PX: CYSTOSCOPY W/ RETROGRADES: SHX1426

## 2014-06-18 HISTORY — PX: TRANSURETHRAL RESECTION OF BLADDER TUMOR WITH GYRUS (TURBT-GYRUS): SHX6458

## 2014-06-18 SURGERY — TRANSURETHRAL RESECTION OF BLADDER TUMOR WITH GYRUS (TURBT-GYRUS)
Anesthesia: General | Site: Bladder

## 2014-06-18 MED ORDER — ONDANSETRON HCL 4 MG/2ML IJ SOLN
INTRAMUSCULAR | Status: DC | PRN
  Administered 2014-06-18: 4 mg via INTRAVENOUS

## 2014-06-18 MED ORDER — HYDROMORPHONE HCL PF 1 MG/ML IJ SOLN
INTRAMUSCULAR | Status: AC
  Filled 2014-06-18: qty 1

## 2014-06-18 MED ORDER — IOHEXOL 350 MG/ML SOLN
INTRAVENOUS | Status: DC | PRN
  Administered 2014-06-18: 6 mL

## 2014-06-18 MED ORDER — HYDROMORPHONE HCL PF 1 MG/ML IJ SOLN
0.5000 mg | INTRAMUSCULAR | Status: DC | PRN
  Administered 2014-06-18: 0.25 mg via INTRAVENOUS
  Filled 2014-06-18: qty 1

## 2014-06-18 MED ORDER — SENNOSIDES-DOCUSATE SODIUM 8.6-50 MG PO TABS: 1.0000 | ORAL_TABLET | Freq: Two times a day (BID) | ORAL | Status: DC

## 2014-06-18 MED ORDER — DEXAMETHASONE SODIUM PHOSPHATE 4 MG/ML IJ SOLN
INTRAMUSCULAR | Status: DC | PRN
  Administered 2014-06-18: 8 mg via INTRAVENOUS

## 2014-06-18 MED ORDER — GLYCOPYRROLATE 0.2 MG/ML IJ SOLN
INTRAMUSCULAR | Status: DC | PRN
  Administered 2014-06-18: 0.2 mg via INTRAVENOUS

## 2014-06-18 MED ORDER — METOCLOPRAMIDE HCL 5 MG/ML IJ SOLN
INTRAMUSCULAR | Status: DC | PRN
  Administered 2014-06-18: 10 mg via INTRAVENOUS

## 2014-06-18 MED ORDER — FENTANYL CITRATE 0.05 MG/ML IJ SOLN
INTRAMUSCULAR | Status: DC | PRN
  Administered 2014-06-18: 50 ug via INTRAVENOUS
  Administered 2014-06-18: 75 ug via INTRAVENOUS
  Administered 2014-06-18: 25 ug via INTRAVENOUS
  Administered 2014-06-18: 50 ug via INTRAVENOUS

## 2014-06-18 MED ORDER — LIDOCAINE HCL (CARDIAC) 20 MG/ML IV SOLN
INTRAVENOUS | Status: DC | PRN
  Administered 2014-06-18: 75 mg via INTRAVENOUS

## 2014-06-18 MED ORDER — FENTANYL CITRATE 0.05 MG/ML IJ SOLN
INTRAMUSCULAR | Status: AC
  Filled 2014-06-18: qty 4

## 2014-06-18 MED ORDER — MITOMYCIN CHEMO FOR BLADDER INSTILLATION 40 MG
40.0000 mg | Freq: Once | INTRAVENOUS | Status: AC
Start: 2014-06-18 — End: 2014-06-18
  Administered 2014-06-18: 40 mg via INTRAVESICAL
  Filled 2014-06-18: qty 40

## 2014-06-18 MED ORDER — CIPROFLOXACIN IN D5W 400 MG/200ML IV SOLN
INTRAVENOUS | Status: AC
  Filled 2014-06-18: qty 200

## 2014-06-18 MED ORDER — MIDAZOLAM HCL 5 MG/5ML IJ SOLN
INTRAMUSCULAR | Status: DC | PRN
  Administered 2014-06-18: 2 mg via INTRAVENOUS

## 2014-06-18 MED ORDER — KETOROLAC TROMETHAMINE 30 MG/ML IJ SOLN
INTRAMUSCULAR | Status: DC | PRN
  Administered 2014-06-18: 30 mg via INTRAVENOUS

## 2014-06-18 MED ORDER — CIPROFLOXACIN IN D5W 400 MG/200ML IV SOLN
400.0000 mg | INTRAVENOUS | Status: AC
  Administered 2014-06-18: 400 mg via INTRAVENOUS
  Filled 2014-06-18: qty 200

## 2014-06-18 MED ORDER — ACETAMINOPHEN 10 MG/ML IV SOLN
INTRAVENOUS | Status: DC | PRN
  Administered 2014-06-18: 1000 mg via INTRAVENOUS

## 2014-06-18 MED ORDER — OXYCODONE-ACETAMINOPHEN 5-325 MG PO TABS: 1.0000 | ORAL_TABLET | Freq: Four times a day (QID) | ORAL | Status: DC | PRN

## 2014-06-18 MED ORDER — SUCCINYLCHOLINE CHLORIDE 20 MG/ML IJ SOLN
INTRAMUSCULAR | Status: DC | PRN
  Administered 2014-06-18: 60 mg via INTRAVENOUS

## 2014-06-18 MED ORDER — SODIUM CHLORIDE 0.9 % IR SOLN
Status: DC | PRN
  Administered 2014-06-18: 3000 mL via INTRAVESICAL

## 2014-06-18 MED ORDER — LACTATED RINGERS IV SOLN
INTRAVENOUS | Status: DC
Start: 2014-06-18 — End: 2014-06-18
  Administered 2014-06-18: 14:00:00 via INTRAVENOUS
  Filled 2014-06-18: qty 1000

## 2014-06-18 MED ORDER — MIDAZOLAM HCL 2 MG/2ML IJ SOLN
INTRAMUSCULAR | Status: AC
  Filled 2014-06-18: qty 2

## 2014-06-18 MED ORDER — PROPOFOL 10 MG/ML IV BOLUS
INTRAVENOUS | Status: DC | PRN
  Administered 2014-06-18: 200 mg via INTRAVENOUS

## 2014-06-18 MED ORDER — LACTATED RINGERS IV SOLN
INTRAVENOUS | Status: DC
  Administered 2014-06-18: 11:00:00 via INTRAVENOUS
  Filled 2014-06-18: qty 1000

## 2014-06-18 SURGICAL SUPPLY — 32 items
BAG URINE DRAINAGE (UROLOGICAL SUPPLIES) IMPLANT
BAG URINE LEG 19OZ MD ST LTX (BAG) IMPLANT
BAG URO CATCHER STRL LF (DRAPE) ×3 IMPLANT
BASKET ZERO TIP NITINOL 2.4FR (BASKET) IMPLANT
CATH FOLEY 2WAY SLVR  5CC 18FR (CATHETERS) ×1
CATH FOLEY 2WAY SLVR  5CC 22FR (CATHETERS)
CATH FOLEY 2WAY SLVR 30CC 20FR (CATHETERS) IMPLANT
CATH FOLEY 2WAY SLVR 5CC 18FR (CATHETERS) ×2 IMPLANT
CATH FOLEY 2WAY SLVR 5CC 22FR (CATHETERS) IMPLANT
CATH INTERMIT  6FR 70CM (CATHETERS) ×3 IMPLANT
CLOTH BEACON ORANGE TIMEOUT ST (SAFETY) ×3 IMPLANT
DRAPE CAMERA CLOSED 9X96 (DRAPES) ×3 IMPLANT
ELECT LOOP MED HF 24F 12D CBL (CLIP) ×3 IMPLANT
ELECT REM PT RETURN 9FT ADLT (ELECTROSURGICAL)
ELECT RESECT VAPORIZE 12D CBL (ELECTRODE) IMPLANT
ELECTRODE REM PT RTRN 9FT ADLT (ELECTROSURGICAL) IMPLANT
EVACUATOR MICROVAS BLADDER (UROLOGICAL SUPPLIES) IMPLANT
GLOVE BIO SURGEON STRL SZ 6.5 (GLOVE) ×3 IMPLANT
GLOVE BIO SURGEON STRL SZ7.5 (GLOVE) ×3 IMPLANT
GOWN PREVENTION PLUS XLARGE (GOWN DISPOSABLE) IMPLANT
GOWN STRL NON-REIN LRG LVL3 (GOWN DISPOSABLE) IMPLANT
GOWN STRL REUS W/ TWL LRG LVL3 (GOWN DISPOSABLE) ×2 IMPLANT
GOWN STRL REUS W/ TWL XL LVL3 (GOWN DISPOSABLE) ×2 IMPLANT
GOWN STRL REUS W/TWL LRG LVL3 (GOWN DISPOSABLE) ×1
GOWN STRL REUS W/TWL XL LVL3 (GOWN DISPOSABLE) ×1
GUIDEWIRE ANG ZIPWIRE 038X150 (WIRE) IMPLANT
GUIDEWIRE STR DUAL SENSOR (WIRE) ×3 IMPLANT
IV NS IRRIG 3000ML ARTHROMATIC (IV SOLUTION) ×6 IMPLANT
PACK CYSTOSCOPY (CUSTOM PROCEDURE TRAY) ×3 IMPLANT
PLUG CATH AND CAP STER (CATHETERS) ×3 IMPLANT
SET ASPIRATION TUBING (TUBING) IMPLANT
SYRINGE IRR TOOMEY STRL 70CC (SYRINGE) IMPLANT

## 2014-06-18 NOTE — Anesthesia Postprocedure Evaluation (Signed)
  Anesthesia Post-op Note  Patient: [name]  Procedure(s) Performed: Procedure(s) (LRB): TRANSURETHRAL RESECTION OF BLADDER TUMOR WITH GYRUS (TURBT-GYRUS) WITH MITOMYCIN INSTILLATION (N/A) CYSTOSCOPY WITH RETROGRADE PYELOGRAM (Bilateral)  Patient Location: PACU  Anesthesia Type: General  Level of Consciousness: awake and alert   Airway and Oxygen Therapy: Patient Spontanous Breathing  Post-op Pain: mild  Post-op Assessment: Post-op Vital signs reviewed, Patient's Cardiovascular Status Stable, Respiratory Function Stable, Patent Airway and No signs of Nausea or vomiting  Last Vitals:  Filed Vitals:   06/18/14 1345  BP: 126/81  Pulse: 85  Temp:   Resp: 13    Post-op Vital Signs: stable   Complications: No apparent anesthesia complications

## 2014-06-18 NOTE — Transfer of Care (Signed)
Immediate Anesthesia Transfer of Care Note  Patient: [name]  Procedure(s) Performed: Procedure(s) (LRB): TRANSURETHRAL RESECTION OF BLADDER TUMOR WITH GYRUS (TURBT-GYRUS) WITH MITOMYCIN INSTILLATION (N/A) CYSTOSCOPY WITH RETROGRADE PYELOGRAM (Bilateral)  Patient Location: PACU  Anesthesia Type: General  Level of Consciousness: awake, alert  and oriented  Airway & Oxygen Therapy: Patient Spontanous Breathing and Patient connected to nasal cannula oxygen  Post-op Assessment: Report given to PACU RN and Post -op Vital signs reviewed and stable  Post vital signs: Reviewed and stable  Complications: No apparent anesthesia complications

## 2014-06-18 NOTE — Anesthesia Procedure Notes (Signed)
Procedure Name: LMA Insertion Date/Time: 06/18/2014 12:56 PM Performed by: Mechele Claude Pre-anesthesia Checklist: Patient identified, Emergency Drugs available, Suction available and Patient being monitored Patient Re-evaluated:Patient Re-evaluated prior to inductionOxygen Delivery Method: Circle System Utilized Preoxygenation: Pre-oxygenation with 100% oxygen Intubation Type: IV induction Ventilation: Mask ventilation without difficulty LMA: LMA inserted LMA Size: 4.0 Number of attempts: 1 Airway Equipment and Method: bite block Placement Confirmation: positive ETCO2 Tube secured with: Tape Dental Injury: Teeth and Oropharynx as per pre-operative assessment

## 2014-06-18 NOTE — Anesthesia Preprocedure Evaluation (Addendum)
Anesthesia Evaluation  Patient identified by MRN, date of birth, ID band Patient awake    Reviewed: Allergy & Precautions, H&P , NPO status , Patient's Chart, lab work & pertinent test results  Airway Mallampati: II TM Distance: >3 FB Neck ROM: full    Dental no notable dental hx. (+) Teeth Intact, Dental Advisory Given   Pulmonary neg pulmonary ROS, former smoker,  breath sounds clear to auscultation  Pulmonary exam normal       Cardiovascular Exercise Tolerance: Good negative cardio ROS  Rhythm:regular Rate:Normal     Neuro/Psych negative neurological ROS  negative psych ROS   GI/Hepatic negative GI ROS, Neg liver ROS,   Endo/Other  negative endocrine ROS  Renal/GU negative Renal ROS  negative genitourinary   Musculoskeletal   Abdominal   Peds  Hematology negative hematology ROS (+)   Anesthesia Other Findings   Reproductive/Obstetrics negative OB ROS                          Anesthesia Physical Anesthesia Plan  ASA: II  Anesthesia Plan: General   Post-op Pain Management:    Induction: Intravenous  Airway Management Planned: LMA  Additional Equipment:   Intra-op Plan:   Post-operative Plan:   Informed Consent: I have reviewed the patients History and Physical, chart, labs and discussed the procedure including the risks, benefits and alternatives for the proposed anesthesia with the patient or authorized representative who has indicated his/her understanding and acceptance.   Dental Advisory Given  Plan Discussed with: CRNA and Surgeon  Anesthesia Plan Comments:         Anesthesia Quick Evaluation

## 2014-06-18 NOTE — Discharge Instructions (Signed)
1 - You may have urinary urgency (bladder spasms) and bloody urine on / off x 1-2 weeks. This is normal.  2 - Call MD or go to ER for fever >102, severe pain / nausea / vomiting not relieved by medications, or acute change in medical status  Post Anesthesia Home Care Instructions  Activity: Get plenty of rest for the remainder of the day. A responsible adult should stay with you for 24 hours following the procedure.  For the next 24 hours, DO NOT: -Drive a car -Paediatric nurse -Drink alcoholic beverages -Take any medication unless instructed by your physician -Make any legal decisions or sign important papers.  Meals: Start with liquid foods such as gelatin or soup. Progress to regular foods as tolerated. Avoid greasy, spicy, heavy foods. If nausea and/or vomiting occur, drink only clear liquids until the nausea and/or vomiting subsides. Call your physician if vomiting continues.  Special Instructions/Symptoms: Your throat may feel dry or sore from the anesthesia or the breathing tube placed in your throat during surgery. If this causes discomfort, gargle with warm salt water. The discomfort should disappear within 24 hours.  HOME CARE  INSTRUCTIONS  Activity: Rest for the remainder of the day.  Do not drive or operate equipment today.  You may resume normal activities in one to two days as instructed by your physician.   Meals: Drink plenty of liquids and eat light foods such as gelatin or soup this evening.  You may return to a normal meal plan tomorrow.  Return to Work: You may return to work in one to two days or as instructed by your physician.  Special Instructions / Symptoms: Call your physician if any of these symptoms occur:   -persistent or heavy bleeding  -bleeding which continues after first few urination  -large blood clots that are difficult to pass  -urine stream diminishes or stops completely  -fever equal to or higher than 101 degrees Farenheit.  -cloudy  urine with a strong, foul odor  -severe pain  Females should always wipe from front to back after elimination.  You may feel some burning pain when you urinate.  This should disappear with time.  Applying moist heat to the lower abdomen or a hot tub bath may help relieve the pain. \  Follow-Up / Date of Return Visit to Your Physician:   Call for an appointment to arrange follow-up.  Patient Signature:  ________________________________________________________  Nurse's Signature:  ________________________________________________________

## 2014-06-18 NOTE — Brief Op Note (Signed)
06/18/2014  1:28 PM  PATIENT:  [name]  53 y.o. male  PRE-OPERATIVE DIAGNOSIS:  BLADDER CANCER  POST-OPERATIVE DIAGNOSIS:  BLADDER CANCER  PROCEDURE:  Procedure(s): TRANSURETHRAL RESECTION OF BLADDER TUMOR WITH GYRUS (TURBT-GYRUS) WITH MITOMYCIN INSTILLATION (N/A) CYSTOSCOPY WITH RETROGRADE PYELOGRAM (Bilateral)  SURGEON:  Surgeon(s) and Role:    * Alexis Frock, MD - Primary  PHYSICIAN ASSISTANT:   ASSISTANTS: none   ANESTHESIA:   general  EBL:  Total I/O In: 400 [I.V.:400] Out: -   BLOOD ADMINISTERED:none  DRAINS: 71F foley   LOCAL MEDICATIONS USED:  NONE  SPECIMEN:  Source of Specimen:  old resection site, base of old resection site, prostatic urethra  DISPOSITION OF SPECIMEN:  PATHOLOGY  COUNTS:  YES  TOURNIQUET:  * No tourniquets in log *  DICTATION: .Other Dictation: Dictation Number [id number]  PLAN OF CARE: Discharge to home after PACU  PATIENT DISPOSITION:  PACU - hemodynamically stable.   Delay start of Pharmacological VTE agent (>24hrs) due to surgical blood loss or risk of bleeding: not applicable

## 2014-06-18 NOTE — H&P (Signed)
[name] is an 53 y.o. male.    Chief Complaint: PRe-OP Transurethral Resection of Bladder Tumor  HPI:   1 - High Grade Bladder Cancer - Found on w/u gross hematuria 2013. Remote 10PY smoker. No dye / chemical exposure.   Recent Treatment / Surveillance History: 10/03/2012 - TURBT + Left JJ stent for TaG1 tumor near left UO. CT Urogram + RPG's negative upper tracts. 02/2013 - NED; 05/2013 - NED (mild erythema at lateral edge of old resection site w/o papillary changes) 01/2014 - TaG3 multifocal by TURBT with muscle in specimen. CT Urogram + RPG's negative upper tracts.  PMH sig for inguinal hernia repair as a child, acalculous cholecystitis. No CV diseas. No blood thinners.   Today "[name]" is seen to proceed with restaging TURBT for his recurrent high grade bladder cancer.  Past Medical History  Diagnosis Date  . Bladder cancer     RECURRENT (HX  TURBT  2013)  . History of colon polyps   . Cholelithiasis     ASYMPTOMATIC  . Diverticulosis of colon   . Lower urinary tract symptoms (LUTS)   . BPH (benign prostatic hyperplasia)   . Insomnia   . Wears glasses   . Borderline hypertension     Past Surgical History  Procedure Laterality Date  . Tonsillectomy  as child  . Hernia repair  as child    right inguinal  . Transurethral resection of bladder tumor  10/03/2012    Procedure: TRANSURETHRAL RESECTION OF BLADDER TUMOR (TURBT);  Surgeon: Alexis Frock, MD;  Location: [facility];  Service: Urology;  Laterality: N/A;  . Cystoscopy with retrograde pyelogram, ureteroscopy and stent placement  10/03/2012    Procedure: [place], URETEROSCOPY AND STENT PLACEMENT;  Surgeon: Alexis Frock, MD;  Location: [facility];  Service: Urology;  Laterality: Left;    . Colonoscopy  2014  . Transurethral resection of bladder tumor with gyrus (turbt-gyrus) N/A 02/19/2014    Procedure: TRANSURETHRAL RESECTION OF BLADDER TUMOR WITH GYRUS  (TURBT-GYRUS);  Surgeon: Alexis Frock, MD;  Location: [facility];  Service: Urology;  Laterality: N/A;  . Cystoscopy w/ retrogrades Bilateral 02/19/2014    Procedure: CYSTOSCOPY WITH RETROGRADE PYELOGRAM;  Surgeon: Alexis Frock, MD;  [facility];  Service: Urology;  Laterality: Bilateral;    Family History  Problem Relation Age of Onset  . Lung cancer Mother   . Colon cancer      fathers side   . ADD / ADHD Son    Social History:  reports that he quit smoking about 12 years ago. His smoking use included Cigarettes. He has a 10 pack-year smoking history. He has never used smokeless tobacco. He reports that he drinks about one ounce of alcohol per week. He reports that he does not use illicit drugs.  Allergies:  Allergies  Allergen Reactions  . Bee Venom Anaphylaxis  . Penicillins Other (See Comments)    Unknown childhood allergy    No prescriptions prior to admission    No results found for this or any previous visit (from the past 64 hour(s)). No results found.  Review of Systems  Constitutional: Negative.  Negative for fever and chills.  HENT: Negative.   Eyes: Negative.   Respiratory: Negative.   Cardiovascular: Negative.   Gastrointestinal: Negative.  Negative for nausea and vomiting.  Genitourinary: Negative for hematuria and flank pain.  Musculoskeletal: Negative.   Skin: Negative.   Neurological: Negative.   Endo/Heme/Allergies:  Negative.     There were no vitals taken for this visit. Physical Exam  Constitutional: He is oriented to person, place, and time. He appears well-developed and well-nourished.  HENT:  Head: Normocephalic.  Eyes: Pupils are equal, round, and reactive to light.  Neck: Normal range of motion. Neck supple.  Cardiovascular: Normal rate.   Respiratory: Effort normal and breath sounds normal.  GI: Soft. Bowel sounds are normal.  Genitourinary: Penis normal.  Musculoskeletal: Normal range of  motion.  Neurological: He is alert and oriented to person, place, and time.  Skin: Skin is warm and dry.  Psychiatric: He has a normal mood and affect. His behavior is normal. Judgment and thought content normal.     Assessment/Plan  1 - High Grade Bladder Cancer - tumor now high grade, but non-invasive.   We rediscussed operative biopsy / transurethral resection as the best next step for diagnostic and therapeutic purposes with goals being to remove all visible cancer and obtain tissue for pathologic exam. We rediscussed that for some low-grade tumors, this may be all the treatment required, but that for many other tumors such as high-grade lesions, further therapy including surgery and or chemotherapy may be warranted. We also outlined the fact that any bladder cancer diagnosis will require close follow-up with periodic upper and lower tract evaluation. We rediscussed risks including bleeding, infection, damage to kidney / ureter / bladder including bladder perforation which can typically managed with prolonged foley catheterization. We mentioned anesthetic and other rare risks including DVT, PE, MI, and mortality. I also rementioned that adjunctive procedures such as ureteral stenting, retrograde pyelography, and ureteroscopy may be necessary to fully evaluate the urinary tract depending on intra-operative findings. After answering all questions to the patient's satisfaction, they wish to proceed today as planned.  [name] 06/18/2014, 6:38 AM

## 2014-06-19 ENCOUNTER — Encounter (HOSPITAL_BASED_OUTPATIENT_CLINIC_OR_DEPARTMENT_OTHER): Payer: Self-pay | Admitting: Urology

## 2014-06-19 NOTE — Op Note (Signed)
[name], [name]                ACCOUNT NO.:  [id number]  MEDICAL RECORD NO.:  [id number]  LOCATION:                                 FACILITY:  PHYSICIAN:  Alexis Frock, MD     DATE OF BIRTH:  [birth year]  DATE OF PROCEDURE:  06/18/2014 DATE OF DISCHARGE:  06/18/2014                              OPERATIVE REPORT   DIAGNOSIS:  High-grade bladder cancer.  PROCEDURE: 1. Restaging transurethral resection of bladder tumor, volume medium. 2. Bilateral retrograde pyelogram interpretation. 3. Instillation of mitomycin-C chemotherapeutic agent.  ESTIMATED BLOOD LOSS:  Nil.  COMPLICATIONS:  None.  SPECIMENS: 1. Old resection site. 2. Base of old resection site. 3. Prostatic urethral lesion.  FINDINGS: 1. No evidence of gross recurrence of old resection sites. 2. Small papillary tissue in the area of the right base of the     prostate. 3. Unremarkable retrograde pyelograms bilaterally. 4 . Total bladder resected area approximately 4cm2  DRAIN:  An 18-French Foley catheter straight drain.  INDICATION:  [name] is a very pleasant 53 year old gentleman with history of low-grade now high-grade  recurrent superficial bladder cancer.  He most recently underwent transurethral resection several months ago, which reflected high-grade superficial disease.  He presents now for restaging resection along with mitomycin instillation.  Informed consent was obtained and placed in the medical record.  PROCEDURE IN DETAIL:  The patient being [name] was verified. Procedure being restaging transurethral resection of bladder tumor was confirmed.  Procedure was carried out.  Time-out was performed. Intravenous antibiotics were administered.  General LMA anesthesia was introduced.  The patient was placed into a low lithotomy position and a sterile field was created prepping and draping the patient's penis, perineum, and proximal thighs using iodine x3.  Next, cystourethroscopy was  performed using a 22-French rigid scope with 12-degree offset lens. Inspection of the anterior and posterior urethra unremarkable. Inspection of the bladder neck revealed a small papillary tissue in the right base of the prostate worrisome for possible tumor in this area. However, this was a very subtle.  There were multiple old resection sites seen, the largest which was lateral to the left ureteral orifice. There was no gross residual tumor or recurrent tumor at these locations, ureteral orifices normal in anatomic position.  Attention was directed to bilateral retrograde pyelogram.  The left ureteral orifice was cannulated with a 6-French catheter and right retrograde pyelogram was obtained.  Left retrograde pyelogram demonstrated a single left ureter, a single system left kidney.  No filling defects or narrowing noted.  Similarly, right retrograde pyelogram was obtained.  Right retrograde pyelogram demonstrated a single right ureter, a single system right kidney.  No filling defects or narrowing noted.  Next, the cystoscope was exchanged for the 26-French ACMI continuous flow resectoscope sheath and systematic resection was performed of the old resection sites.  Then what appeared to be the fibromuscular bladder stroma, this performed after a small amount of succinylcholine was given to avoid obturator reflex and this did not occur.  These total volume approximately 4 cm2. These set aside for permanent pathology, labeled old resection site.  Next, cold cup biopsy forceps were used to obtain  what appeared to be muscular bites from the deep aspect of the resection site, these were set aside and labeled base of old resection site. Additional point coagulation current was applied as was coagulation current at the edges of the old resection sites.  There was no evidence of perforation.  Hemostasis appeared excellent.  Additional specimen was obtained using resection loop of the right  base of the prostate at the area of subtly noted papillary tissue set aside labeled prostatic urethral lesion. Following these maneuvers, hemostasis appeared excellent.  It was felt that it would be safe to proceed with mitomycin as such an 18-French Foley catheter was placed per urethra to straight drain.  The patient was awakened, taken to postanesthesia care unit in stable condition.  In the postanesthesia care unit, 40 mg and 40 mL of mitomycin was instilled via the Foley catheter into the level of the urinary bladder. This was held in place for 1 hour and then released.  The patient tolerated the procedure well.  There were no immediate periprocedural complications.  The patient was taken to the postanesthesia care unit in a stable condition.          ______________________________ Alexis Frock, MD     TM/MEDQ  D:  06/18/2014  T:  06/18/2014  Job:  [id number]

## 2014-06-19 NOTE — Telephone Encounter (Signed)
lmom for pt to cb

## 2014-06-26 NOTE — Telephone Encounter (Signed)
lmom for pt to cb and sch

## 2014-07-29 ENCOUNTER — Other Ambulatory Visit: Payer: Self-pay | Admitting: Internal Medicine

## 2014-07-29 NOTE — Telephone Encounter (Signed)
Ok to refill through next appt in February

## 2014-07-30 NOTE — Telephone Encounter (Signed)
Called to the pharmacy and left on voicemail. 

## 2014-09-03 ENCOUNTER — Ambulatory Visit: Payer: 59 | Admitting: Family Medicine

## 2014-09-03 ENCOUNTER — Ambulatory Visit (INDEPENDENT_AMBULATORY_CARE_PROVIDER_SITE_OTHER): Payer: 59 | Admitting: Family Medicine

## 2014-09-03 DIAGNOSIS — Z23 Encounter for immunization: Secondary | ICD-10-CM

## 2014-11-03 ENCOUNTER — Ambulatory Visit (INDEPENDENT_AMBULATORY_CARE_PROVIDER_SITE_OTHER): Payer: 59 | Admitting: Internal Medicine

## 2014-11-03 ENCOUNTER — Encounter: Payer: Self-pay | Admitting: Internal Medicine

## 2014-11-03 VITALS — BP 122/74 | HR 74 | Temp 98.2°F | Wt 209.0 lb

## 2014-11-03 DIAGNOSIS — R6884 Jaw pain: Secondary | ICD-10-CM

## 2014-11-03 DIAGNOSIS — J329 Chronic sinusitis, unspecified: Secondary | ICD-10-CM

## 2014-11-03 MED ORDER — CEFUROXIME AXETIL 500 MG PO TABS: 500.0000 mg | ORAL_TABLET | Freq: Two times a day (BID) | ORAL | Status: DC

## 2014-11-03 NOTE — Patient Instructions (Signed)
Use decongestant   saline nose spray .    To help the sinus discomfort and if continuing can add antibiotic as discussed .    Sinusitis Sinusitis is redness, soreness, and inflammation of the paranasal sinuses. Paranasal sinuses are air pockets within the bones of your face (beneath the eyes, the middle of the forehead, or above the eyes). In healthy paranasal sinuses, mucus is able to drain out, and air is able to circulate through them by way of your nose. However, when your paranasal sinuses are inflamed, mucus and air can become trapped. This can allow bacteria and other germs to grow and cause infection. Sinusitis can develop quickly and last only a short time (acute) or continue over a long period (chronic). Sinusitis that lasts for more than 12 weeks is considered chronic.  CAUSES  Causes of sinusitis include:  Allergies.  Structural abnormalities, such as displacement of the cartilage that separates your nostrils (deviated septum), which can decrease the air flow through your nose and sinuses and affect sinus drainage.  Functional abnormalities, such as when the small hairs (cilia) that line your sinuses and help remove mucus do not work properly or are not present. SIGNS AND SYMPTOMS  Symptoms of acute and chronic sinusitis are the same. The primary symptoms are pain and pressure around the affected sinuses. Other symptoms include:  Upper toothache.  Earache.  Headache.  Bad breath.  Decreased sense of smell and taste.  A cough, which worsens when you are lying flat.  Fatigue.  Fever.  Thick drainage from your nose, which often is green and may contain pus (purulent).  Swelling and warmth over the affected sinuses. DIAGNOSIS  Your health care provider will perform a physical exam. During the exam, your health care provider may:  Look in your nose for signs of abnormal growths in your nostrils (nasal polyps).  Tap over the affected sinus to check for signs of  infection.  View the inside of your sinuses (endoscopy) using an imaging device that has a light attached (endoscope). If your health care provider suspects that you have chronic sinusitis, one or more of the following tests may be recommended:  Allergy tests.  Nasal culture. A sample of mucus is taken from your nose, sent to a lab, and screened for bacteria.  Nasal cytology. A sample of mucus is taken from your nose and examined by your health care provider to determine if your sinusitis is related to an allergy. TREATMENT  Most cases of acute sinusitis are related to a viral infection and will resolve on their own within 10 days. Sometimes medicines are prescribed to help relieve symptoms (pain medicine, decongestants, nasal steroid sprays, or saline sprays).  However, for sinusitis related to a bacterial infection, your health care provider will prescribe antibiotic medicines. These are medicines that will help kill the bacteria causing the infection.  Rarely, sinusitis is caused by a fungal infection. In theses cases, your health care provider will prescribe antifungal medicine. For some cases of chronic sinusitis, surgery is needed. Generally, these are cases in which sinusitis recurs more than 3 times per year, despite other treatments. HOME CARE INSTRUCTIONS   Drink plenty of water. Water helps thin the mucus so your sinuses can drain more easily.  Use a humidifier.  Inhale steam 3 to 4 times a day (for example, sit in the bathroom with the shower running).  Apply a warm, moist washcloth to your face 3 to 4 times a day, or as directed by  your health care provider.  Use saline nasal sprays to help moisten and clean your sinuses.  Take medicines only as directed by your health care provider.  If you were prescribed either an antibiotic or antifungal medicine, finish it all even if you start to feel better. SEEK IMMEDIATE MEDICAL CARE IF:  You have increasing pain or severe  headaches.  You have nausea, vomiting, or drowsiness.  You have swelling around your face.  You have vision problems.  You have a stiff neck.  You have difficulty breathing. MAKE SURE YOU:   Understand these instructions.  Will watch your condition.  Will get help right away if you are not doing well or get worse. Document Released: 10/10/2005 Document Revised: 02/24/2014 Document Reviewed: 10/25/2011 [facility] Patient Information 2015 [place], [place]. This information is not intended to replace advice given to you by your health care provider. Make sure you discuss any questions you have with your health care provider.

## 2014-11-03 NOTE — Progress Notes (Signed)
Pre visit review using our clinic review tool, if applicable. No additional management support is needed unless otherwise documented below in the visit note. 

## 2014-11-03 NOTE — Progress Notes (Signed)
Chief Complaint  Patient presents with  . Sinusitis    left jaw face paina n swelling    HPI: Patient [name]  comes in today for SDA for  new problem evaluation. I think i have a sinus infection and getting worse and swollen and to lef tneck   Worse when chewing .    No pain with swallowing    Hearing ok   ? Not a tooth .    Hx of same .   Sinus stopped up.  In past same sx with sinus infection ROS: See pertinent positives and negatives per HPI. No cough  On  BCG now for baldder cancer  Hx of pcna ll as a child doesn't remember any problems  Going to [place] trip this week   Past Medical History  Diagnosis Date  . Bladder cancer     RECURRENT (HX  TURBT  2013)  . History of colon polyps   . Cholelithiasis     ASYMPTOMATIC  . Diverticulosis of colon   . Lower urinary tract symptoms (LUTS)   . BPH (benign prostatic hyperplasia)   . Insomnia   . Wears glasses   . Borderline hypertension     Family History  Problem Relation Age of Onset  . Lung cancer Mother   . Colon cancer      fathers side   . ADD / ADHD Son     History   Social History  . Marital Status: Married    Spouse Name: N/A    Number of Children: N/A  . Years of Education: N/A   Social History Main Topics  . Smoking status: Former Smoker -- 1.00 packs/day for 10 years    Types: Cigarettes    Quit date: 10/24/2001  . Smokeless tobacco: Never Used  . Alcohol Use: 1.0 oz/week    2 drink(s) per week  . Drug Use: No  . Sexual Activity: None   Other Topics Concern  . 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 cyclobenzaprine (FLEXERIL) 10 MG tablet TAKE 1 TABLET THREE TIMES DAILY AS NEEDED FOR MUSCLE SPASMS.  Marland Kitchen EPINEPHrine (EPIPEN) 0.3 mg/0.3 mL DEVI Inject 0.3 mLs (0.3 mg total) into the muscle once.  Marland Kitchen  oxyCODONE-acetaminophen (ROXICET) 5-325 MG per tablet Take 1-2 tablets by mouth every 6 (six) hours as needed for moderate pain or severe pain. Post-operaitvely  . zolpidem (AMBIEN) 10 MG tablet TAKE 1 TABLET AT BEDTIME AS NEEDED FOR SLEEP.  . cefUROXime (CEFTIN) 500 MG tablet Take 1 tablet (500 mg total) by mouth 2 (two) times daily. For sinusitis  . senna-docusate (SENOKOT-S) 8.6-50 MG per tablet Take 1 tablet by mouth 2 (two) times daily. While taking pain meds to prevent constipation. (Patient not taking: Reported on 11/03/2014)    EXAM:  BP 122/74 mmHg  Pulse 74  Temp(Src) 98.2 F (36.8 C) (Oral)  Wt 209 lb (94.802 kg)  Body mass index is 27.94 kg/(m^2).  GENERAL: vitals reviewed and listed above, alert, oriented, appears well hydrated and in no acute distress HEENT: atraumatic, conjunctiva  clear, no obvious abnormalities on inspection of external nose and ears mild conngestion left more than right op no ;  Lesion  OP : no  lesion edema or exudate face mild tender left max   Neg tmj pain pos click TMS nl  NECK: no obvious masses on inspection palpation  tendner left ac area  ? Inc ln  MS: moves all extremities without noticeable focal  abnormality PSYCH: pleasant and cooperative, no obvious depression or anxiety  ASSESSMENT AND PLAN:  Discussed the following assessment and plan:  Jaw pain - poss sinus infection mild congestion empiric rx for this and fu low risk reaction with cephalosporin  Sinusitis, unspecified chronicity, unspecified location - possible left max  -Patient advised to return or notify health care team  if symptoms worsen ,persist or new concerns arise.  Patient Instructions  Use decongestant   saline nose spray .    To help the sinus discomfort and if continuing can add antibiotic as discussed .    Sinusitis Sinusitis is redness, soreness, and inflammation of the paranasal sinuses. Paranasal sinuses are air pockets within the bones of your face (beneath the  eyes, the middle of the forehead, or above the eyes). In healthy paranasal sinuses, mucus is able to drain out, and air is able to circulate through them by way of your nose. However, when your paranasal sinuses are inflamed, mucus and air can become trapped. This can allow bacteria and other germs to grow and cause infection. Sinusitis can develop quickly and last only a short time (acute) or continue over a long period (chronic). Sinusitis that lasts for more than 12 weeks is considered chronic.  CAUSES  Causes of sinusitis include:  Allergies.  Structural abnormalities, such as displacement of the cartilage that separates your nostrils (deviated septum), which can decrease the air flow through your nose and sinuses and affect sinus drainage.  Functional abnormalities, such as when the small hairs (cilia) that line your sinuses and help remove mucus do not work properly or are not present. SIGNS AND SYMPTOMS  Symptoms of acute and chronic sinusitis are the same. The primary symptoms are pain and pressure around the affected sinuses. Other symptoms include:  Upper toothache.  Earache.  Headache.  Bad breath.  Decreased sense of smell and taste.  A cough, which worsens when you are lying flat.  Fatigue.  Fever.  Thick drainage from your nose, which often is green and may contain pus (purulent).  Swelling and warmth over the affected sinuses. DIAGNOSIS  Your health care provider will perform a physical exam. During the exam, your health care provider may:  Look in your nose for signs of abnormal growths in your nostrils (nasal polyps).  Tap over the affected sinus to check for signs of infection.  View the inside of your sinuses (endoscopy) using an imaging device that has a light attached (endoscope). If your health care provider suspects that you have chronic sinusitis, one or more of the following tests may be recommended:  Allergy tests.  Nasal culture. A sample of  mucus is taken from your nose, sent to a lab, and screened for bacteria.  Nasal cytology. A sample of mucus is taken from your nose and examined by your health care provider to determine if your sinusitis is related to an allergy. TREATMENT  Most cases of acute sinusitis are related to a viral infection and will resolve on their own within 10 days. Sometimes medicines are prescribed to help relieve symptoms (pain medicine, decongestants, nasal steroid sprays, or saline sprays).  However, for sinusitis related to a bacterial infection, your health care provider will prescribe antibiotic medicines. These are  medicines that will help kill the bacteria causing the infection.  Rarely, sinusitis is caused by a fungal infection. In theses cases, your health care provider will prescribe antifungal medicine. For some cases of chronic sinusitis, surgery is needed. Generally, these are cases in which sinusitis recurs more than 3 times per year, despite other treatments. HOME CARE INSTRUCTIONS   Drink plenty of water. Water helps thin the mucus so your sinuses can drain more easily.  Use a humidifier.  Inhale steam 3 to 4 times a day (for example, sit in the bathroom with the shower running).  Apply a warm, moist washcloth to your face 3 to 4 times a day, or as directed by your health care provider.  Use saline nasal sprays to help moisten and clean your sinuses.  Take medicines only as directed by your health care provider.  If you were prescribed either an antibiotic or antifungal medicine, finish it all even if you start to feel better. SEEK IMMEDIATE MEDICAL CARE IF:  You have increasing pain or severe headaches.  You have nausea, vomiting, or drowsiness.  You have swelling around your face.  You have vision problems.  You have a stiff neck.  You have difficulty breathing. MAKE SURE YOU:   Understand these instructions.  Will watch your condition.  Will get help right away if you  are not doing well or get worse. Document Released: 10/10/2005 Document Revised: 02/24/2014 Document Reviewed: 10/25/2011 [facility] Patient Information 2015 [place], [place]. This information is not intended to replace advice given to you by your health care provider. Make sure you discuss any questions you have with your health care provider.      Standley Brooking. Panosh M.D.

## 2014-11-13 ENCOUNTER — Other Ambulatory Visit: Payer: Self-pay | Admitting: Internal Medicine

## 2014-11-19 NOTE — Telephone Encounter (Signed)
Ok x 5 months

## 2014-11-19 NOTE — Telephone Encounter (Signed)
Called to the pharmacy and left on machine. 

## 2014-12-02 ENCOUNTER — Encounter: Payer: Self-pay | Admitting: Internal Medicine

## 2014-12-02 ENCOUNTER — Ambulatory Visit (INDEPENDENT_AMBULATORY_CARE_PROVIDER_SITE_OTHER): Payer: 59 | Admitting: Internal Medicine

## 2014-12-02 VITALS — BP 132/82 | Temp 97.7°F | Ht 72.0 in | Wt 212.8 lb

## 2014-12-02 DIAGNOSIS — G4700 Insomnia, unspecified: Secondary | ICD-10-CM

## 2014-12-02 DIAGNOSIS — Z79899 Other long term (current) drug therapy: Secondary | ICD-10-CM

## 2014-12-02 DIAGNOSIS — R519 Headache, unspecified: Secondary | ICD-10-CM

## 2014-12-02 DIAGNOSIS — C679 Malignant neoplasm of bladder, unspecified: Secondary | ICD-10-CM

## 2014-12-02 DIAGNOSIS — R51 Headache: Secondary | ICD-10-CM

## 2014-12-02 DIAGNOSIS — R030 Elevated blood-pressure reading, without diagnosis of hypertension: Secondary | ICD-10-CM

## 2014-12-02 DIAGNOSIS — K8010 Calculus of gallbladder with chronic cholecystitis without obstruction: Secondary | ICD-10-CM

## 2014-12-02 NOTE — Patient Instructions (Signed)
Use ambien as needed .   To avoid dependence on this .  Take blood pressure readings twice a day for  10 -14 days and then periodically .To ensure below 140/90   .Send in readings    .Marland Kitchen Signup for my chart may be easily.  If elevated  we may add medication .  Will do a referral for GB surgery .   Plan cpx and labs in about 6 months  Or so  Call for refill

## 2014-12-02 NOTE — Progress Notes (Signed)
Pre visit review using our clinic review tool, if applicable. No additional management support is needed unless otherwise documented below in the visit note.  Chief Complaint  Patient presents with  . Follow-up    med 6 month    HPI: [name] 54 y.o. comes in for fu med check sleep .  Went  2 weeks  Without  Med.   And did ok but taking again nice to have if needed  Doesn't feel like dependent  BP :   Had been good   Not checked recently.  Has cell phone recordings   Usually 120 range  ocass 130   hasnt eaten as well recently. No cp sob cv sx.   Gall bladder still bothers him w/ Certain foods  Ready for surgery referral and removal remote hx of consult in the past.   ? Getting a sinus infection left jaw face area tender a bit mild congestion  Som inc with chewing .  No fever  Finishing up cancer treatment doing well   ROS: See pertinent positives and negatives per HPI. Some nasal post drainage  No fever sweats   Past Medical History  Diagnosis Date  . Bladder cancer     RECURRENT (HX  TURBT  2013)  . History of colon polyps   . Cholelithiasis     ASYMPTOMATIC  . Diverticulosis of colon   . Lower urinary tract symptoms (LUTS)   . BPH (benign prostatic hyperplasia)   . Insomnia   . Wears glasses   . Borderline hypertension     Family History  Problem Relation Age of Onset  . Lung cancer Mother   . Colon cancer      fathers side   . ADD / ADHD Son     History   Social History  . Marital Status: Married    Spouse Name: N/A    Number of Children: N/A  . Years of Education: N/A   Social History Main Topics  . Smoking status: Former Smoker -- 1.00 packs/day for 10 years    Types: Cigarettes    Quit date: 10/24/2001  . Smokeless tobacco: Never Used  . Alcohol Use: 1.0 oz/week    2 drink(s) per week  . Drug Use: No  . Sexual Activity: None   Other Topics Concern  . 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 cyclobenzaprine (FLEXERIL) 10 MG tablet TAKE 1 TABLET THREE TIMES DAILY AS NEEDED FOR MUSCLE SPASMS.  Marland Kitchen EPINEPHrine (EPIPEN) 0.3 mg/0.3 mL DEVI Inject 0.3 mLs (0.3 mg total) into the muscle once.  Marland Kitchen zolpidem (AMBIEN) 10 MG tablet TAKE 1 TABLET AT BEDTIME AS NEEDED FOR SLEEP.  Marland Kitchen oxyCODONE-acetaminophen (ROXICET) 5-325 MG per tablet Take 1-2 tablets by mouth every 6 (six) hours as needed for moderate pain or severe pain. Post-operaitvely (Patient not taking: Reported on 12/02/2014)  . senna-docusate (SENOKOT-S) 8.6-50 MG per tablet Take 1 tablet by mouth 2 (two) times daily. While taking pain meds to prevent constipation. (Patient not taking: Reported on 11/03/2014)  . [DISCONTINUED] cefUROXime (CEFTIN) 500 MG tablet Take 1 tablet (500 mg total) by mouth 2 (two) times daily. For sinusitis    EXAM:  BP 132/82 mmHg  Temp(Src) 97.7  F (36.5 C) (Oral)  Ht 6' (1.829 m)  Wt 212 lb 12.8 oz (96.525 kg)  BMI 28.85 kg/m2  Body mass index is 28.85 kg/(m^2).  GENERAL: vitals reviewed and listed above, alert, oriented, appears well hydrated and in no acute distress HEENT: atraumatic, conjunctiva  clear, no obvious abnormalities on inspection of external nose and ears OP : no lesion edema or exudate  nares mild congestion NECK: no obvious masses on inspection palpation  LUNGS: clear to auscultation bilaterally, no wheezes, rales or rhonchi, good air movement CV: HRRR, no clubbing cyanosis or  peripheral edema nl cap refill  MS: moves all extremities without noticeable focal  abnormality PSYCH: pleasant and cooperative, no obvious depression or anxiety  ASSESSMENT AND PLAN:  Discussed the following assessment and plan:  Insomnia  Borderline high blood pressure - improves on repeat  recheck a week per month at home sign uup for my chart getor get [place] readings    Medication management  Left-sided face pain - dental; vs sinus  add flionase for at least 2 weeks and see dentist  Malignant neoplasm of urinary bladder, unspecified site - high grade   bcg   Chronic cholecystitis with calculus - sx off an on  refer  - Plan: Ambulatory referral to General Surgery 6 months cpx wellness -Patient advised to return or notify health care team  if symptoms worsen ,persist or new concerns arise.  Patient Instructions  Use ambien as needed .   To avoid dependence on this .  Take blood pressure readings twice a day for  10 -14 days and then periodically .To ensure below 140/90   .Send in readings    .Marland Kitchen Signup for my chart may be easily.  If elevated  we may add medication .  Will do a referral for GB surgery .   Plan cpx and labs in about 6 months  Or so  Call for refill       Mariann Laster K. Panosh M.D.

## 2015-01-05 ENCOUNTER — Other Ambulatory Visit (INDEPENDENT_AMBULATORY_CARE_PROVIDER_SITE_OTHER): Payer: Self-pay | Admitting: Surgery

## 2015-01-05 NOTE — H&P (Signed)
[address] "Billy" WArrion Gordon 01/05/2015 10:10 AM Location: [facility] Patient #: [id number] DOB: 04/12/1961 Married / Language: Cleophus Gordon / Race: White Male History of Present Illness [name] MD; 01/05/2015 10:57 AM) Patient words: gallbladder.  The patient is a 54 year old male who presents for evaluation of gall stones. Patient sent by his primary care physician for concern of worsening gallbladder attacks. Pleasant active male. Has had intermittent upper abdominal pains with nausea in the past. Recalls an ultrasound many years ago that was abnormal. Had CAT scan done 2013 that confirmed cholelithiasis. Switch to a low-fat diet. That usually has worked. However the past few years he's had a more intense attack suspicious with heavier foods. He will get discomfort in his RIGHT upper quadrant. Some bloating. Some nausea. Never to the point of emesis. He does have mild heartburn but problems controls that. This is not like that. Because the attacks are happening more intensely and more easily, he is more open to consider surgery now. Patient has bowel movement once or twice a day. No history of IBS. No Crohn's. No ulcerative colitis. Colonoscopy with mild diverticulosis noted in 2014. Family history of colon polyp/cancer. He can exercise to miles without difficulty. He's never had any abdominal or hernia surgeries. In's. Did have a bladder cancer addressed by cystoscopy. Other Problems [name], CMA; 01/05/2015 10:10 AM) Bladder Problems Cancer  Past Surgical History [name], CMA; 01/05/2015 10:10 AM) No pertinent past surgical history  Diagnostic Studies History [name], CMA; 01/05/2015 10:10 AM) Colonoscopy within last year  Allergies [name], CMA; 01/05/2015 10:11 AM) Penicillamine *ASSORTED CLASSES* Bee Pollen Plus Ginseng *ALTERNATIVE MEDICINES*  Medication History ([name], CMA; 01/05/2015 10:12 AM) Flexeril (10MG  Tablet, Oral as  needed) Active. Zolpidem Tartrate (10MG  Tablet, Oral as needed) Active. EpiPen 2-Pak (0.3 MG/0.3ML(1:1000) Device, Intramuscular as needed) Active. Medications Reconciled  Social History [name], CMA; 01/05/2015 10:10 AM) Alcohol use Occasional alcohol use. Caffeine use Coffee, Tea. No drug use Tobacco use Former smoker.  Family History [name], CMA; 01/05/2015 10:10 AM) Colon Polyps Father. Heart Disease Father. Respiratory Condition Mother.     Review of Systems [name] CMA; 01/05/2015 10:10 AM) General Not Present- Appetite Loss, Chills, Fatigue, Fever, Night Sweats, Weight Gain and Weight Loss. Skin Not Present- Change in Wart/Mole, Dryness, Hives, Jaundice, New Lesions, Non-Healing Wounds, Rash and Ulcer. HEENT Not Present- Earache, Hearing Loss, Hoarseness, Nose Bleed, Oral Ulcers, Ringing in the Ears, Seasonal Allergies, Sinus Pain, Sore Throat, Visual Disturbances, Wears glasses/contact lenses and Yellow Eyes. Respiratory Not Present- Bloody sputum, Chronic Cough, Difficulty Breathing, Snoring and Wheezing. Breast Not Present- Breast Mass, Breast Pain, Nipple Discharge and Skin Changes. Cardiovascular Not Present- Chest Pain, Difficulty Breathing Lying Down, Leg Cramps, Palpitations, Rapid Heart Rate, Shortness of Breath and Swelling of Extremities. Gastrointestinal Present- Bloating and Gets full quickly at meals. Not Present- Abdominal Pain, Bloody Stool, Change in Bowel Habits, Chronic diarrhea, Constipation, Difficulty Swallowing, Excessive gas, Hemorrhoids, Indigestion, Nausea, Rectal Pain and Vomiting. Male Genitourinary Not Present- Blood in Urine, Change in Urinary Stream, Frequency, Impotence, Nocturia, Painful Urination, Urgency and Urine Leakage. Musculoskeletal Not Present- Back Pain, Joint Pain, Joint Stiffness, Muscle Pain, Muscle Weakness and Swelling of Extremities. Neurological Not Present- Decreased Memory, Fainting, Headaches, Numbness,  Seizures, Tingling, Tremor, Trouble walking and Weakness. Psychiatric Not Present- Anxiety, Bipolar, Change in Sleep Pattern, Depression, Fearful and Frequent crying. Endocrine Not Present- Cold Intolerance, Excessive Hunger, Hair Changes, Heat Intolerance, Hot flashes and New Diabetes. Hematology Not  Present- Easy Bruising, Excessive bleeding, Gland problems, HIV and Persistent Infections.  Vitals ([name] CMA; 01/05/2015 10:11 AM) 01/05/2015 10:10 AM Weight: 212 lb Height: 73in Body Surface Area: 2.23 m Body Mass Index: 27.97 kg/m Temp.: 97.75F(Temporal)  Pulse: 77 (Regular)  BP: 134/78 (Sitting, Left Arm, Standard)     Physical Exam [name] MD; 01/05/2015 10:58 AM)  General Mental Status-Alert. General Appearance-Not in acute distress, Not Sickly. Orientation-Oriented X3. Hydration-Well hydrated. Voice-Normal.  Integumentary Global Assessment Upon inspection and palpation of skin surfaces of the - Axillae: non-tender, no inflammation or ulceration, no drainage. and Distribution of scalp and body hair is normal. General Characteristics Temperature - normal warmth is noted.  Head and Neck Head-normocephalic, atraumatic with no lesions or palpable masses. Face Global Assessment - atraumatic, no absence of expression. Neck Global Assessment - no abnormal movements, no bruit auscultated on the right, no bruit auscultated on the left, no decreased range of motion, non-tender. Trachea-midline. Thyroid Gland Characteristics - non-tender.  Eye Eyeball - Left-Extraocular movements intact, No Nystagmus. Eyeball - Right-Extraocular movements intact, No Nystagmus. Cornea - Left-No Hazy. Cornea - Right-No Hazy. Sclera/Conjunctiva - Left-No scleral icterus, No Discharge. Sclera/Conjunctiva - Right-No scleral icterus, No Discharge. Pupil - Left-Direct reaction to light normal. Pupil - Right-Direct reaction to light  normal.  ENMT Ears Pinna - Left - no drainage observed, no generalized tenderness observed. Right - no drainage observed, no generalized tenderness observed. Nose and Sinuses External Inspection of the Nose - no destructive lesion observed. Inspection of the nares - Left - quiet respiration. Right - quiet respiration. Mouth and Throat Lips - Upper Lip - no fissures observed, no pallor noted. Lower Lip - no fissures observed, no pallor noted. Nasopharynx - no discharge present. Oral Cavity/Oropharynx - Tongue - no dryness observed. Oral Mucosa - no cyanosis observed. Hypopharynx - no evidence of airway distress observed.  Chest and Lung Exam Inspection Movements - Normal and Symmetrical. Accessory muscles - No use of accessory muscles in breathing. Palpation Palpation of the chest reveals - Non-tender. Auscultation Breath sounds - Normal and Clear.  Cardiovascular Auscultation Rhythm - Regular. Murmurs & Other Heart Sounds - Auscultation of the heart reveals - No Murmurs and No Systolic Clicks.  Abdomen Inspection Inspection of the abdomen reveals - No Visible peristalsis and No Abnormal pulsations. Umbilicus - No Bleeding, No Urine drainage. Palpation/Percussion Palpation and Percussion of the abdomen reveal - Soft, Non Tender, No Rebound tenderness, No Rigidity (guarding) and No Cutaneous hyperesthesia. Note: Mild soreness RIGHT upper quadrant. No true Murphy sign. No diastases. Perhaps small 5 mm umbilical hernia through stalk only.   Male Genitourinary Sexual Maturity Tanner 5 - Adult hair pattern and Adult penile size and shape. Note: Circumcised male. Testes epididymides cords normal. No inguinal hernias.   Peripheral Vascular Upper Extremity Inspection - Left - No Cyanotic nailbeds, Not Ischemic. Right - No Cyanotic nailbeds, Not Ischemic.  Neurologic Neurologic evaluation reveals -normal attention span and ability to concentrate, able to name objects and repeat  phrases. Appropriate fund of knowledge , normal sensation and normal coordination. Mental Status Affect - not angry, not paranoid. Cranial Nerves-Normal Bilaterally. Gait-Normal.  Neuropsychiatric Mental status exam performed with findings of-able to articulate well with normal speech/language, rate, volume and coherence, thought content normal with ability to perform basic computations and apply abstract reasoning and no evidence of hallucinations, delusions, obsessions or homicidal/suicidal ideation.  Musculoskeletal Global Assessment Spine, Ribs and Pelvis - no instability, subluxation or laxity. Right Upper Extremity -  no instability, subluxation or laxity.  Lymphatic Head & Neck  General Head & Neck Lymphatics: Bilateral - Description - No Localized lymphadenopathy. Axillary  General Axillary Region: Bilateral - Description - No Localized lymphadenopathy. Femoral & Inguinal  Generalized Femoral & Inguinal Lymphatics: Left - Description - No Localized lymphadenopathy. Right - Description - No Localized lymphadenopathy.    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 The anatomy & physiology of hepatobiliary & pancreatic function was discussed. The pathophysiology of gallbladder dysfunction was discussed. Natural history risks without surgery was discussed. I feel the risks of no intervention will lead to serious problems that outweigh the operative risks; therefore, I recommended cholecystectomy to remove the pathology. I explained laparoscopic techniques with possible need for an open approach. Probable cholangiogram to evaluate the bilary tract was explained as well.  Risks such as bleeding, infection, abscess, leak, injury to other organs, need for further treatment, heart attack, death, and  other risks were discussed. I noted a good likelihood this will help address the problem. Possibility that this will not correct all abdominal symptoms was explained. Goals of post-operative recovery were discussed as well. We will work to minimize complications. An educational handout further explaining the pathology and treatment options was given as well. Questions were answered. The patient expresses understanding & wishes to proceed with surgery. Pt Education - [facility] Laparosopic Post Op HCI (Ameliana Brashear) Pt Education - [facility] Pain Control (Roquel Burgin) Pt Education - [facility] Good Bowel Health (Jayceion Lisenby)  [name], M.D., F.A.C.S. Gastrointestinal and Minimally Invasive Surgery [facility] [address], [place], [place] [id number] [phone] Main / Paging

## 2015-01-12 ENCOUNTER — Other Ambulatory Visit: Payer: Self-pay | Admitting: Family Medicine

## 2015-03-15 ENCOUNTER — Encounter (HOSPITAL_COMMUNITY): Payer: Self-pay | Admitting: Emergency Medicine

## 2015-03-15 ENCOUNTER — Emergency Department (HOSPITAL_COMMUNITY): Payer: 59

## 2015-03-15 ENCOUNTER — Emergency Department (HOSPITAL_COMMUNITY)
Admission: EM | Admit: 2015-03-15 | Discharge: 2015-03-15 | Disposition: A | Discharge status: Home / Self Care | Payer: 59 | Attending: Emergency Medicine | Admitting: Emergency Medicine

## 2015-03-15 DIAGNOSIS — K5732 Diverticulitis of large intestine without perforation or abscess without bleeding: Secondary | ICD-10-CM | POA: Diagnosis not present

## 2015-03-15 DIAGNOSIS — Z86010 Personal history of colonic polyps: Secondary | ICD-10-CM | POA: Diagnosis not present

## 2015-03-15 DIAGNOSIS — Z79899 Other long term (current) drug therapy: Secondary | ICD-10-CM | POA: Diagnosis not present

## 2015-03-15 DIAGNOSIS — R1013 Epigastric pain: Secondary | ICD-10-CM | POA: Diagnosis present

## 2015-03-15 DIAGNOSIS — Z87448 Personal history of other diseases of urinary system: Secondary | ICD-10-CM | POA: Insufficient documentation

## 2015-03-15 DIAGNOSIS — K209 Esophagitis, unspecified without bleeding: Secondary | ICD-10-CM

## 2015-03-15 DIAGNOSIS — Z87891 Personal history of nicotine dependence: Secondary | ICD-10-CM | POA: Insufficient documentation

## 2015-03-15 DIAGNOSIS — Z8551 Personal history of malignant neoplasm of bladder: Secondary | ICD-10-CM | POA: Insufficient documentation

## 2015-03-15 DIAGNOSIS — Z880 Allergy status to penicillin: Secondary | ICD-10-CM | POA: Insufficient documentation

## 2015-03-15 DIAGNOSIS — G4700 Insomnia, unspecified: Secondary | ICD-10-CM | POA: Diagnosis not present

## 2015-03-15 LAB — CBC WITH DIFFERENTIAL/PLATELET
BASOS ABS: 0 10*3/uL (ref 0.0–0.1)
Basophils Relative: 0 % (ref 0–1)
Eosinophils Absolute: 0.3 10*3/uL (ref 0.0–0.7)
Eosinophils Relative: 2 % (ref 0–5)
HCT: 43.7 % (ref 39.0–52.0)
Hemoglobin: 15.1 g/dL (ref 13.0–17.0)
Lymphocytes Relative: 24 % (ref 12–46)
Lymphs Abs: 3.1 10*3/uL (ref 0.7–4.0)
MCH: 30.8 pg (ref 26.0–34.0)
MCHC: 34.6 g/dL (ref 30.0–36.0)
MCV: 89 fL (ref 78.0–100.0)
Monocytes Absolute: 1 10*3/uL (ref 0.1–1.0)
Monocytes Relative: 8 % (ref 3–12)
NEUTROS ABS: 8.3 10*3/uL — AB (ref 1.7–7.7)
Neutrophils Relative %: 66 % (ref 43–77)
Platelets: 218 10*3/uL (ref 150–400)
RBC: 4.91 MIL/uL (ref 4.22–5.81)
RDW: 13.2 % (ref 11.5–15.5)
WBC: 12.6 10*3/uL — ABNORMAL HIGH (ref 4.0–10.5)

## 2015-03-15 LAB — COMPREHENSIVE METABOLIC PANEL
ALBUMIN: 4.5 g/dL (ref 3.5–5.0)
ALT: 22 U/L (ref 17–63)
AST: 19 U/L (ref 15–41)
Alkaline Phosphatase: 69 U/L (ref 38–126)
Anion gap: 9 (ref 5–15)
BUN: 15 mg/dL (ref 6–20)
CALCIUM: 9.4 mg/dL (ref 8.9–10.3)
CHLORIDE: 103 mmol/L (ref 101–111)
CO2: 27 mmol/L (ref 22–32)
Creatinine, Ser: 0.91 mg/dL (ref 0.61–1.24)
GFR calc Af Amer: >60 mL/min (ref >60)
GFR calc non Af Amer: >60 mL/min (ref >60)
GLUCOSE: 110 mg/dL — AB (ref 65–99)
Potassium: 3.5 mmol/L (ref 3.5–5.1)
Sodium: 139 mmol/L (ref 135–145)
Total Bilirubin: 1.1 mg/dL (ref 0.3–1.2)
Total Protein: 7.8 g/dL (ref 6.5–8.1)

## 2015-03-15 LAB — URINALYSIS, ROUTINE W REFLEX MICROSCOPIC
Glucose, UA: NEGATIVE mg/dL
HGB URINE DIPSTICK: NEGATIVE
KETONES UR: 15 mg/dL — AB
Leukocytes, UA: NEGATIVE
Nitrite: NEGATIVE
PH: 5.5 (ref 5.0–8.0)
Protein, ur: NEGATIVE mg/dL
SPECIFIC GRAVITY, URINE: 1.04 — AB (ref 1.005–1.030)
UROBILINOGEN UA: 0.2 mg/dL (ref 0.0–1.0)

## 2015-03-15 LAB — D-DIMER, QUANTITATIVE (NOT AT ARMC): D DIMER QUANT: 0.53 ug{FEU}/mL — AB (ref 0.00–0.48)

## 2015-03-15 LAB — TROPONIN I: Troponin I: <0.03 ng/mL (ref <0.031)

## 2015-03-15 LAB — LIPASE, BLOOD: Lipase: 27 U/L (ref 22–51)

## 2015-03-15 MED ORDER — LEVOFLOXACIN 500 MG PO TABS
500.0000 mg | ORAL_TABLET | Freq: Once | ORAL | Status: AC
  Administered 2015-03-15: 500 mg via ORAL
  Filled 2015-03-15: qty 1

## 2015-03-15 MED ORDER — HYDROCODONE-ACETAMINOPHEN 5-325 MG PO TABS: 1.0000 | ORAL_TABLET | ORAL | Status: DC | PRN

## 2015-03-15 MED ORDER — LEVOFLOXACIN 500 MG PO TABS
500.0000 mg | ORAL_TABLET | Freq: Every day | ORAL | Status: DC
Start: 2015-03-15 — End: 2015-07-03

## 2015-03-15 MED ORDER — PANTOPRAZOLE SODIUM 20 MG PO TBEC: 20.0000 mg | DELAYED_RELEASE_TABLET | Freq: Every day | ORAL | Status: DC

## 2015-03-15 MED ORDER — METRONIDAZOLE 500 MG PO TABS
500.0000 mg | ORAL_TABLET | Freq: Once | ORAL | Status: AC
  Administered 2015-03-15: 500 mg via ORAL
  Filled 2015-03-15: qty 1

## 2015-03-15 MED ORDER — METRONIDAZOLE 500 MG PO TABS: 500.0000 mg | ORAL_TABLET | Freq: Two times a day (BID) | ORAL | Status: DC

## 2015-03-15 MED ORDER — HYDROCODONE-ACETAMINOPHEN 5-325 MG PO TABS
1.0000 | ORAL_TABLET | Freq: Once | ORAL | Status: DC
  Filled 2015-03-15: qty 1

## 2015-03-15 MED ORDER — IOHEXOL 350 MG/ML SOLN
100.0000 mL | Freq: Once | INTRAVENOUS | Status: AC | PRN
  Administered 2015-03-15: 100 mL via INTRAVENOUS

## 2015-03-15 MED ORDER — PANTOPRAZOLE SODIUM 40 MG PO TBEC
40.0000 mg | DELAYED_RELEASE_TABLET | Freq: Once | ORAL | Status: AC
  Administered 2015-03-15: 40 mg via ORAL
  Filled 2015-03-15: qty 1

## 2015-03-15 NOTE — Discharge Instructions (Signed)
Diverticulitis Diverticulitis is inflammation or infection of small pouches in your colon that form when you have a condition called diverticulosis. The pouches in your colon are called diverticula. Your colon, or large intestine, is where water is absorbed and stool is formed. Complications of diverticulitis can include:  Bleeding.  Severe infection.  Severe pain.  Perforation of your colon.  Obstruction of your colon. CAUSES  Diverticulitis is caused by bacteria. Diverticulitis happens when stool becomes trapped in diverticula. This allows bacteria to grow in the diverticula, which can lead to inflammation and infection. RISK FACTORS People with diverticulosis are at risk for diverticulitis. Eating a diet that does not include enough fiber from fruits and vegetables may make diverticulitis more likely to develop. SYMPTOMS  Symptoms of diverticulitis may include:  Abdominal pain and tenderness. The pain is normally located on the left side of the abdomen, but may occur in other areas.  Fever and chills.  Bloating.  Cramping.  Nausea.  Vomiting.  Constipation.  Diarrhea.  Blood in your stool. DIAGNOSIS  Your health care provider will ask you about your medical history and do a physical exam. You may need to have tests done because many medical conditions can cause the same symptoms as diverticulitis. Tests may include:  Blood tests.  Urine tests.  Imaging tests of the abdomen, including X-rays and CT scans. When your condition is under control, your health care provider may recommend that you have a colonoscopy. A colonoscopy can show how severe your diverticula are and whether something else is causing your symptoms. TREATMENT  Most cases of diverticulitis are mild and can be treated at home. Treatment may include:  Taking over-the-counter pain medicines.  Following a clear liquid diet.  Taking antibiotic medicines by mouth for 7-10 days. More severe cases may  be treated at a hospital. Treatment may include:  Not eating or drinking.  Taking prescription pain medicine.  Receiving antibiotic medicines through an IV tube.  Receiving fluids and nutrition through an IV tube.  Surgery. HOME CARE INSTRUCTIONS   Follow your health care provider's instructions carefully.  Follow a full liquid diet or other diet as directed by your health care provider. After your symptoms improve, your health care provider may tell you to change your diet. He or she may recommend you eat a high-fiber diet. Fruits and vegetables are good sources of fiber. Fiber makes it easier to pass stool.  Take fiber supplements or probiotics as directed by your health care provider.  Only take medicines as directed by your health care provider.  Keep all your follow-up appointments. SEEK MEDICAL CARE IF:   Your pain does not improve.  You have a hard time eating food.  Your bowel movements do not return to normal. SEEK IMMEDIATE MEDICAL CARE IF:   Your pain becomes worse.  Your symptoms do not get better.  Your symptoms suddenly get worse.  You have a fever.  You have repeated vomiting.  You have bloody or black, tarry stools. MAKE SURE YOU:   Understand these instructions.  Will watch your condition.  Will get help right away if you are not doing well or get worse. Document Released: 07/20/2005 Document Revised: 10/15/2013 Document Reviewed: 09/04/2013 [facility] Patient Information 2015 [place], [place]. This information is not intended to replace advice given to you by your health care provider. Make sure you discuss any questions you have with your health care provider. Esophagitis Esophagitis is inflammation of the esophagus. It can involve swelling, soreness, and pain  in the esophagus. This condition can make it difficult and painful to swallow. CAUSES  Most causes of esophagitis are not serious. Many different factors can cause esophagitis,  including:  Gastroesophageal reflux disease (GERD). This is when acid from your stomach flows up into the esophagus.  Recurrent vomiting.  An allergic-type reaction.  Certain medicines, especially those that come in large pills.  Ingestion of harmful chemicals, such as household cleaning products.  Heavy alcohol use.  An infection of the esophagus.  Radiation treatment for cancer.  Certain diseases such as sarcoidosis, Crohn's disease, and scleroderma. These diseases may cause recurrent esophagitis. SYMPTOMS   Trouble swallowing.  Painful swallowing.  Chest pain.  Difficulty breathing.  Nausea.  Vomiting.  Abdominal pain. DIAGNOSIS  Your caregiver will take your history and do a physical exam. Depending upon what your caregiver finds, certain tests may also be done, including:  Barium X-ray. You will drink a solution that coats the esophagus, and X-rays will be taken.  Endoscopy. A lighted tube is put down the esophagus so your caregiver can examine the area.  Allergy tests. These can sometimes be arranged through follow-up visits. TREATMENT  Treatment will depend on the cause of your esophagitis. In some cases, steroids or other medicines may be given to help relieve your symptoms or to treat the underlying cause of your condition. Medicines that may be recommended include:  Viscous lidocaine, to soothe the esophagus.  Antacids.  Acid reducers.  Proton pump inhibitors.  Antiviral medicines for certain viral infections of the esophagus.  Antifungal medicines for certain fungal infections of the esophagus.  Antibiotic medicines, depending on the cause of the esophagitis. HOME CARE INSTRUCTIONS   Avoid foods and drinks that seem to make your symptoms worse.  Eat small, frequent meals instead of large meals.  Avoid eating for the 3 hours prior to your bedtime.  If you have trouble taking pills, use a pill splitter to decrease the size and likelihood of  the pill getting stuck or injuring the esophagus on the way down. Drinking water after taking a pill also helps.  Stop smoking if you smoke.  Maintain a healthy weight.  Wear loose-fitting clothing. Do not wear anything tight around your waist that causes pressure on your stomach.  Raise the head of your bed 6 to 8 inches with wood blocks to help you sleep. Extra pillows will not help.  Only take over-the-counter or prescription medicines as directed by your caregiver. SEEK IMMEDIATE MEDICAL CARE IF:  You have severe chest pain that radiates into your arm, neck, or jaw.  You feel sweaty, dizzy, or lightheaded.  You have shortness of breath.  You vomit blood.  You have difficulty or pain with swallowing.  You have bloody or black, tarry stools.  You have a fever.  You have a burning sensation in the chest more than 3 times a week for more than 2 weeks.  You cannot swallow, drink, or eat.  You drool because you cannot swallow your saliva. MAKE SURE YOU:  Understand these instructions.  Will watch your condition.  Will get help right away if you are not doing well or get worse. Document Released: 11/17/2004 Document Revised: 01/02/2012 Document Reviewed: 06/10/2011 [facility] Patient Information 2015 [place], [place]. This information is not intended to replace advice given to you by your health care provider. Make sure you discuss any questions you have with your health care provider.

## 2015-03-15 NOTE — ED Provider Notes (Signed)
CSN: [id number]     Arrival date & time 03/15/15  1827 History   First MD Initiated Contact with Patient 03/15/15 1845     Chief Complaint  Patient presents with  . Abdominal Pain     (Consider location/radiation/quality/duration/timing/severity/associated sxs/prior Treatment) HPI The patient states he has been having left lateral abdominal pain for about a day and a half. Is an aching sharp type of pain at about the level of his flank. He denies any pain, burning or urgency with urination. The patient does have a history of bladder cancer and reports that he has not had any symptoms and has been cancer free for about 2 years. He also notes that he's had "heartburn" with a burning quality of pain in his epigastrium and slightly into the lower chest. It's been coming and going, lasting a few minutes at a time. The patient states occasionally he has felt a little short of breath which she attributed to the pain that he's having in his back and flank. No cough or fever. No lower extremity swelling or calf pain. The patient reports he has known problems with his gallbladder and he is supposed to get it surgically removed at some point. Past Medical History  Diagnosis Date  . Bladder cancer     RECURRENT (HX  TURBT  2013)  . History of colon polyps   . Cholelithiasis     ASYMPTOMATIC  . Diverticulosis of colon   . Lower urinary tract symptoms (LUTS)   . BPH (benign prostatic hyperplasia)   . Insomnia   . Wears glasses   . Borderline hypertension    Past Surgical History  Procedure Laterality Date  . Tonsillectomy  as child  . Hernia repair  as child    right inguinal  . Transurethral resection of bladder tumor  10/03/2012    Procedure: TRANSURETHRAL RESECTION OF BLADDER TUMOR (TURBT);  Surgeon: Alexis Frock, MD;  Location: [facility]  Service: Urology;  Laterality: N/A;  . Cystoscopy with retrograde pyelogram, ureteroscopy and stent placement  10/03/2012    Procedure:  [place], URETEROSCOPY AND STENT PLACEMENT;  Surgeon: Alexis Frock, MD;  Location: [facility];  Service: Urology;  Laterality: Left;    . Colonoscopy  2014  . Transurethral resection of bladder tumor with gyrus (turbt-gyrus) N/A 02/19/2014    Procedure: TRANSURETHRAL RESECTION OF BLADDER TUMOR WITH GYRUS (TURBT-GYRUS);  Surgeon: Alexis Frock, MD;  Location: [facility];  Service: Urology;  Laterality: N/A;  . Cystoscopy w/ retrogrades Bilateral 02/19/2014    Procedure: CYSTOSCOPY WITH RETROGRADE PYELOGRAM;  Surgeon: Alexis Frock, MD;  Location: [facility];  Service: Urology;  Laterality: Bilateral;  . Transurethral resection of bladder tumor with gyrus (turbt-gyrus) N/A 06/18/2014    Procedure: TRANSURETHRAL RESECTION OF BLADDER TUMOR WITH GYRUS (TURBT-GYRUS) WITH MITOMYCIN INSTILLATION;  Surgeon: Alexis Frock, MD;  Location: [facility];  Service: Urology;  Laterality: N/A;  . Cystoscopy w/ retrogrades Bilateral 06/18/2014    Procedure: CYSTOSCOPY WITH RETROGRADE PYELOGRAM;  Surgeon: Alexis Frock, MD;  Location: [facility];  Service: Urology;  Laterality: Bilateral;   Family History  Problem Relation Age of Onset  . Lung cancer Mother   . Colon cancer      fathers side   . ADD / ADHD Son    History  Substance Use Topics  . Smoking status: Former Smoker -- 1.00 packs/day for 10 years    Types: Cigarettes  Quit date: 10/24/2001  . Smokeless tobacco: Never Used  . Alcohol Use: 1.0 oz/week    2 drink(s) per week    Review of Systems 10 Systems reviewed and are negative for acute change except as noted in the HPI.    Allergies  Bee venom and Penicillins  Home Medications   Prior to Admission medications   Medication Sig Start Date End Date Taking? Authorizing Provider  cyclobenzaprine (FLEXERIL) 10 MG tablet TAKE 1 TABLET THREE TIMES DAILY AS NEEDED FOR MUSCLE SPASMS.  05/30/14  Yes Burnis Medin, MD  EPINEPHrine (EPIPEN) 0.3 mg/0.3 mL DEVI Inject 0.3 mLs (0.3 mg total) into the muscle once. 02/01/12  Yes Burnis Medin, MD  zolpidem (AMBIEN) 10 MG tablet TAKE 1 TABLET AT BEDTIME AS NEEDED FOR SLEEP. 11/19/14  Yes Burnis Medin, MD  HYDROcodone-acetaminophen (NORCO/VICODIN) 5-325 MG per tablet Take 1-2 tablets by mouth every 4 (four) hours as needed for moderate pain or severe pain. 03/15/15   Charlesetta Shanks, MD  levofloxacin (LEVAQUIN) 500 MG tablet Take 1 tablet (500 mg total) by mouth daily. 03/15/15   Charlesetta Shanks, MD  metroNIDAZOLE (FLAGYL) 500 MG tablet Take 1 tablet (500 mg total) by mouth 2 (two) times daily. 03/15/15   Charlesetta Shanks, MD  oxyCODONE-acetaminophen (ROXICET) 5-325 MG per tablet Take 1-2 tablets by mouth every 6 (six) hours as needed for moderate pain or severe pain. Post-operaitvely Patient not taking: Reported on 12/02/2014 06/18/14   Alexis Frock, MD  pantoprazole (PROTONIX) 20 MG tablet Take 1 tablet (20 mg total) by mouth daily. 03/15/15   Charlesetta Shanks, MD  senna-docusate (SENOKOT-S) 8.6-50 MG per tablet Take 1 tablet by mouth 2 (two) times daily. While taking pain meds to prevent constipation. Patient not taking: Reported on 11/03/2014 06/18/14   Alexis Frock, MD   BP 123/89 mmHg  Pulse 76  Temp(Src) 98.2 F (36.8 C) (Oral)  Resp 21  SpO2 98% Physical Exam  Constitutional: He is oriented to person, place, and time. He appears well-developed and well-nourished.  HENT:  Head: Normocephalic and atraumatic.  Eyes: EOM are normal. Pupils are equal, round, and reactive to light.  Neck: Neck supple.  Cardiovascular: Normal rate, regular rhythm, normal heart sounds and intact distal pulses.   Pulmonary/Chest: Effort normal and breath sounds normal.  Abdominal: Soft. Bowel sounds are normal. He exhibits no distension. There is tenderness.  Left lateral abdominal tenderness to palpation moderate to severe. No appreciable mass. Lower  quadrants and groin are nontender without mass or fullness.  Musculoskeletal: Normal range of motion. He exhibits no edema.  Neurological: He is alert and oriented to person, place, and time. He has normal strength. Coordination normal. GCS eye subscore is 4. GCS verbal subscore is 5. GCS motor subscore is 6.  Skin: Skin is warm, dry and intact.  Psychiatric: He has a normal mood and affect.    ED Course  Procedures (including critical care time) Labs Review Labs Reviewed  CBC WITH DIFFERENTIAL/PLATELET - Abnormal; Notable for the following:    WBC 12.6 (*)    Neutro Abs 8.3 (*)    All other components within normal limits  COMPREHENSIVE METABOLIC PANEL - Abnormal; Notable for the following:    Glucose, Bld 110 (*)    All other components within normal limits  URINALYSIS, ROUTINE W REFLEX MICROSCOPIC - Abnormal; Notable for the following:    Color, Urine AMBER (*)    Specific Gravity, Urine 1.040 (*)    Bilirubin Urine SMALL (*)  Ketones, ur 15 (*)    All other components within normal limits  D-DIMER, QUANTITATIVE - Abnormal; Notable for the following:    D-Dimer, Quant 0.53 (*)    All other components within normal limits  LIPASE, BLOOD  TROPONIN I    Imaging Review Ct Angio Chest Pe W/cm &/or Wo Cm  03/15/2015   CLINICAL DATA:  Abdominal pain  EXAM: CT ANGIOGRAPHY CHEST  CT ABDOMEN AND PELVIS WITH CONTRAST  TECHNIQUE: Multidetector CT imaging of the chest was performed using the standard protocol during bolus administration of intravenous contrast. Multiplanar CT image reconstructions and MIPs were obtained to evaluate the vascular anatomy. Multidetector CT imaging of the abdomen and pelvis was performed using the standard protocol during bolus administration of intravenous contrast.  CONTRAST:  149mL OMNIPAQUE IOHEXOL 350 MG/ML SOLN  COMPARISON:  None.  FINDINGS: CTA CHEST FINDINGS  THORACIC INLET/BODY WALL:  1 cm nodule in the left thyroid gland is considered incidental based  on size. No evidence of lower neck adenopathy.  MEDIASTINUM:  Normal heart size. No pericardial effusion. No acute vascular abnormality, including aortic dissection or evidence of pulmonary embolism. Diffuse thickening of the esophagus with periesophageal fat infiltration. No adenopathy.  LUNG WINDOWS:  No consolidation.  No effusion.  No suspicious pulmonary nodule.  OSSEOUS:  No acute fracture.  No suspicious lytic or blastic lesions.  CT ABDOMEN and PELVIS FINDINGS  BODY WALL: Left inguinal hernia, fatty.  Liver: No focal abnormality.  Biliary: Multiple gallstones. No evidence of inflammation or biliary obstruction.  Pancreas: Unremarkable.  Spleen: Unremarkable.  Adrenals: Unremarkable.  Kidneys and ureters: No hydronephrosis or stone.  Bladder: Unremarkable.  Reproductive: No pathologic findings.  Bowel: Colonic diverticulosis with active inflammation around a descending colonic diverticulum. No abscess or evidence of perforation. Normal appendix.  Retroperitoneum: No mass or adenopathy.  Peritoneum: No ascites or pneumoperitoneum.  Vascular: No acute abnormality.  OSSEOUS: No acute abnormalities.  Review of the MIP images confirms the above findings.  IMPRESSION: 1. Uncomplicated diverticulitis of the descending colon. 2. No evidence of pulmonary embolism. 3. Diffuse esophagitis. 4. Cholelithiasis.   Electronically Signed   By: Monte Fantasia M.D.   On: 03/15/2015 22:08   Ct Abdomen Pelvis W Contrast  03/15/2015   CLINICAL DATA:  Abdominal pain  EXAM: CT ANGIOGRAPHY CHEST  CT ABDOMEN AND PELVIS WITH CONTRAST  TECHNIQUE: Multidetector CT imaging of the chest was performed using the standard protocol during bolus administration of intravenous contrast. Multiplanar CT image reconstructions and MIPs were obtained to evaluate the vascular anatomy. Multidetector CT imaging of the abdomen and pelvis was performed using the standard protocol during bolus administration of intravenous contrast.  CONTRAST:  135mL  OMNIPAQUE IOHEXOL 350 MG/ML SOLN  COMPARISON:  None.  FINDINGS: CTA CHEST FINDINGS  THORACIC INLET/BODY WALL:  1 cm nodule in the left thyroid gland is considered incidental based on size. No evidence of lower neck adenopathy.  MEDIASTINUM:  Normal heart size. No pericardial effusion. No acute vascular abnormality, including aortic dissection or evidence of pulmonary embolism. Diffuse thickening of the esophagus with periesophageal fat infiltration. No adenopathy.  LUNG WINDOWS:  No consolidation.  No effusion.  No suspicious pulmonary nodule.  OSSEOUS:  No acute fracture.  No suspicious lytic or blastic lesions.  CT ABDOMEN and PELVIS FINDINGS  BODY WALL: Left inguinal hernia, fatty.  Liver: No focal abnormality.  Biliary: Multiple gallstones. No evidence of inflammation or biliary obstruction.  Pancreas: Unremarkable.  Spleen: Unremarkable.  Adrenals: Unremarkable.  Kidneys  and ureters: No hydronephrosis or stone.  Bladder: Unremarkable.  Reproductive: No pathologic findings.  Bowel: Colonic diverticulosis with active inflammation around a descending colonic diverticulum. No abscess or evidence of perforation. Normal appendix.  Retroperitoneum: No mass or adenopathy.  Peritoneum: No ascites or pneumoperitoneum.  Vascular: No acute abnormality.  OSSEOUS: No acute abnormalities.  Review of the MIP images confirms the above findings.  IMPRESSION: 1. Uncomplicated diverticulitis of the descending colon. 2. No evidence of pulmonary embolism. 3. Diffuse esophagitis. 4. Cholelithiasis.   Electronically Signed   By: Monte Fantasia M.D.   On: 03/15/2015 22:08     EKG Interpretation   Date/Time:  Sunday Mar 15 2015 19:41:14 EDT Ventricular Rate:  70 PR Interval:  157 QRS Duration: 111 QT Interval:  407 QTC Calculation: 439 R Axis:   37 Text Interpretation:  Sinus rhythm Low voltage, precordial leads RSR' in  V1 or V2, right VCD or RVH agree. no change from od Confirmed by Johnney Killian,  MD, Jeannie Done [phone]) on  03/15/2015 7:47:41 PM      MDM   Final diagnoses:  Diverticulitis of large intestine without perforation or abscess without bleeding  Esophagitis   Patient is not requiring any additional pain control. CT does confirm early diverticulitis. The patient is appropriate at this time for outpatient management with initiation of antibiotics. Also identified as esophagitis. Patient has been minimally symptomatic with brief episodes of burning discomfort. At this time he advised he needs initiation of PPI and GI follow-up.    Charlesetta Shanks, MD 03/15/15 [phone]

## 2015-03-15 NOTE — ED Notes (Signed)
Per patient, states left lower quadrant pain and heartburn since this afternoon

## 2015-03-20 ENCOUNTER — Encounter: Payer: Self-pay | Admitting: Family Medicine

## 2015-03-20 ENCOUNTER — Ambulatory Visit (INDEPENDENT_AMBULATORY_CARE_PROVIDER_SITE_OTHER): Payer: 59 | Admitting: Family Medicine

## 2015-03-20 VITALS — BP 136/88 | HR 55 | Temp 98.2°F | Ht 72.0 in | Wt 205.0 lb

## 2015-03-20 DIAGNOSIS — K5732 Diverticulitis of large intestine without perforation or abscess without bleeding: Secondary | ICD-10-CM

## 2015-03-20 DIAGNOSIS — R319 Hematuria, unspecified: Secondary | ICD-10-CM

## 2015-03-20 LAB — POCT URINALYSIS DIPSTICK
Bilirubin, UA: NEGATIVE
Glucose, UA: NEGATIVE
Ketones, UA: NEGATIVE
Nitrite, UA: NEGATIVE
Protein, UA: NEGATIVE
RBC UA: NEGATIVE
Spec Grav, UA: 1.015
UROBILINOGEN UA: 0.2
pH, UA: 6.5

## 2015-03-20 NOTE — Progress Notes (Signed)
   Subjective:    Patient ID: [name], male    DOB: 1960-12-20, 54 y.o.   MRN: [id number]  HPI Here for 2 days of passing dark brown urine. Today however his urine has cleared up and seems to be back to normal. He denies any urgency or burning. Of note, he was in the ED 5 days ago with LLQ and left flank pain, and a CT scan showed him to have active diverticulitis. He was placed on Flagyl and Levaquin, and today he feel much better with almost no discomfort. He is drinking lots of water.    Review of Systems  Constitutional: Negative.   Respiratory: Negative.   Cardiovascular: Negative.   Gastrointestinal: Positive for abdominal pain. Negative for nausea, vomiting, diarrhea, constipation, blood in stool, abdominal distention, anal bleeding and rectal pain.  Genitourinary: Negative for dysuria, urgency, frequency, hematuria, flank pain and testicular pain.       Objective:   Physical Exam  Constitutional: He appears well-developed and well-nourished. No distress.  Cardiovascular: Normal rate, regular rhythm, normal heart sounds and intact distal pulses.   Pulmonary/Chest: Effort normal and breath sounds normal.  Abdominal: Soft. Bowel sounds are normal. He exhibits no distension and no mass. There is no tenderness. There is no rebound and no guarding.          Assessment & Plan:  His urine today is clear. It seems that he had some transient inflammation of the walls of the bladder while his diverticulitis was most active, but that the bladder has returned to normal now that the diverticulitis is getting under control. He will finish out the antibiotics and return prn

## 2015-03-20 NOTE — Progress Notes (Signed)
Pre visit review using our clinic review tool, if applicable. No additional management support is needed unless otherwise documented below in the visit note. 

## 2015-04-17 ENCOUNTER — Telehealth: Payer: Self-pay | Admitting: Family Medicine

## 2015-04-17 MED ORDER — ZOLPIDEM TARTRATE 10 MG PO TABS: ORAL_TABLET | ORAL | Status: DC

## 2015-04-17 NOTE — Telephone Encounter (Signed)
Refill request for Zolpidem 10 mg and send to [facility].

## 2015-04-17 NOTE — Telephone Encounter (Signed)
Called to the pharmacy and left on machine. 

## 2015-04-17 NOTE — Addendum Note (Signed)
Addended by: Miles Costain T on: 04/17/2015 12:48 PM   Modules accepted: Orders

## 2015-04-17 NOTE — Telephone Encounter (Signed)
Ok x 3 months refill

## 2015-05-29 ENCOUNTER — Ambulatory Visit: Payer: Commercial Managed Care - HMO

## 2015-05-29 DIAGNOSIS — Z0000 Encounter for general adult medical examination without abnormal findings: Secondary | ICD-10-CM

## 2015-05-29 LAB — CBC WITH DIFFERENTIAL/PLATELET
BASOS ABS: 0 10*3/uL (ref 0.0–0.1)
Basophils Relative: 0.3 % (ref 0.0–3.0)
Eosinophils Absolute: 0.1 10*3/uL (ref 0.0–0.7)
Eosinophils Relative: 1.9 % (ref 0.0–5.0)
HCT: 45.5 % (ref 39.0–52.0)
Hemoglobin: 15.7 g/dL (ref 13.0–17.0)
LYMPHS PCT: 29.7 % (ref 12.0–46.0)
Lymphs Abs: 2.2 10*3/uL (ref 0.7–4.0)
MCHC: 34.4 g/dL (ref 30.0–36.0)
MCV: 93 fl (ref 78.0–100.0)
MONOS PCT: 8.5 % (ref 3.0–12.0)
Monocytes Absolute: 0.6 10*3/uL (ref 0.1–1.0)
Neutro Abs: 4.5 10*3/uL (ref 1.4–7.7)
Neutrophils Relative %: 59.6 % (ref 43.0–77.0)
Platelets: 309 10*3/uL (ref 150.0–400.0)
RBC: 4.89 Mil/uL (ref 4.22–5.81)
RDW: 12.8 % (ref 11.5–15.5)
WBC: 7.6 10*3/uL (ref 4.0–10.5)

## 2015-05-29 LAB — LIPID PANEL
Cholesterol: 156 mg/dL (ref 0–200)
HDL: 31.5 mg/dL — AB (ref >39.00)
LDL CALC: 103 mg/dL — AB (ref 0–99)
NonHDL: 124.19
Total CHOL/HDL Ratio: 5
Triglycerides: 106 mg/dL (ref 0.0–149.0)
VLDL: 21.2 mg/dL (ref 0.0–40.0)

## 2015-05-29 LAB — BASIC METABOLIC PANEL
BUN: 13 mg/dL (ref 6–23)
CHLORIDE: 102 meq/L (ref 96–112)
CO2: 29 mEq/L (ref 19–32)
CREATININE: 1.01 mg/dL (ref 0.40–1.50)
Calcium: 9.5 mg/dL (ref 8.4–10.5)
GFR: 81.56 mL/min (ref >60.00)
GLUCOSE: 93 mg/dL (ref 70–99)
POTASSIUM: 3.9 meq/L (ref 3.5–5.1)
Sodium: 141 mEq/L (ref 135–145)

## 2015-05-29 LAB — HEPATIC FUNCTION PANEL
ALBUMIN: 4.4 g/dL (ref 3.5–5.2)
ALK PHOS: 63 U/L (ref 39–117)
ALT: 16 U/L (ref 0–53)
AST: 14 U/L (ref 0–37)
BILIRUBIN TOTAL: 1 mg/dL (ref 0.2–1.2)
Bilirubin, Direct: 0.2 mg/dL (ref 0.0–0.3)
Total Protein: 7 g/dL (ref 6.0–8.3)

## 2015-05-29 LAB — PSA: PSA: 3.83 ng/mL (ref 0.10–4.00)

## 2015-05-29 LAB — TSH: TSH: 3.01 u[IU]/mL (ref 0.35–4.50)

## 2015-06-03 ENCOUNTER — Telehealth: Payer: Self-pay | Admitting: Internal Medicine

## 2015-06-03 NOTE — Telephone Encounter (Signed)
Pt had appt w/ his sons on 8/16 and they are going on family vacation that week.  resc the boys on thurs,  9/8.  Is it ok for ot to come in that day as well w/ his twins for his cpe?

## 2015-06-05 ENCOUNTER — Encounter: Payer: 59 | Admitting: Internal Medicine

## 2015-06-05 NOTE — Telephone Encounter (Signed)
prefer to work in Friday . ( more slots open) but if  Cant come then then can  Do Thursday    But need 30 minutes and would leave  Only 1 sda open

## 2015-06-09 ENCOUNTER — Encounter: Payer: 59 | Admitting: Internal Medicine

## 2015-06-16 NOTE — Telephone Encounter (Signed)
Pt has been scheduled.  °

## 2015-07-03 ENCOUNTER — Ambulatory Visit (INDEPENDENT_AMBULATORY_CARE_PROVIDER_SITE_OTHER): Payer: Commercial Managed Care - HMO | Admitting: Internal Medicine

## 2015-07-03 ENCOUNTER — Encounter: Payer: Self-pay | Admitting: Internal Medicine

## 2015-07-03 VITALS — BP 150/90 | Temp 98.1°F | Ht 72.0 in | Wt 202.5 lb

## 2015-07-03 DIAGNOSIS — R972 Elevated prostate specific antigen [PSA]: Secondary | ICD-10-CM

## 2015-07-03 DIAGNOSIS — R030 Elevated blood-pressure reading, without diagnosis of hypertension: Secondary | ICD-10-CM

## 2015-07-03 DIAGNOSIS — IMO0001 Reserved for inherently not codable concepts without codable children: Secondary | ICD-10-CM

## 2015-07-03 DIAGNOSIS — Z23 Encounter for immunization: Secondary | ICD-10-CM

## 2015-07-03 DIAGNOSIS — Z0001 Encounter for general adult medical examination with abnormal findings: Secondary | ICD-10-CM | POA: Diagnosis not present

## 2015-07-03 DIAGNOSIS — E786 Lipoprotein deficiency: Secondary | ICD-10-CM | POA: Diagnosis not present

## 2015-07-03 DIAGNOSIS — G4700 Insomnia, unspecified: Secondary | ICD-10-CM | POA: Diagnosis not present

## 2015-07-03 DIAGNOSIS — Z0000 Encounter for general adult medical examination without abnormal findings: Secondary | ICD-10-CM

## 2015-07-03 MED ORDER — ZOLPIDEM TARTRATE 10 MG PO TABS: ORAL_TABLET | ORAL | Status: DC

## 2015-07-03 NOTE — Patient Instructions (Signed)
Continue lifestyle intervention healthy eating and exercise .  Recheck blood test PSA level in 2-3 months before your next urology appt.   Recheck if needed for stress anxiety.  Healthy lifestyle includes : At least 150 minutes of exercise weeks  , weight at healthy levels, which is usually   BMI 19-25. Avoid trans fats and processed foods;  Increase fresh fruits and veges to 5 servings per day. And avoid sweet beverages including tea and juice. Mediterranean diet with olive oil and nuts have been noted to be heart and brain healthy . Avoid tobacco products . Limit  alcohol to  7 per week for women and 14 servings for men.  Get adequate sleep . Wear seat belts . Don't text and drive .

## 2015-07-03 NOTE — Progress Notes (Signed)
Pre visit review using our clinic review tool, if applicable. No additional management support is needed unless otherwise documented below in the visit note.  Chief Complaint  Patient presents with  . Annual Exam    med refill    HPI: Patient  [name]  54 y.o. comes in today for Preventive Health Care visit   Since last visit had episode acute diverticulitis   rx  Had dark urine  Resolved  Noted on ct thickening esoophaguls cw esophagitis  Bu no real sx of such  Thinking of seeing dr Chaney Born for this  Says bp at home is in normal range and monitors   120 - 130 range  Bladder cancer exam 12 15 cysto and last check 8 15 and thinks good prostate exam nl    Will change insurance in November planning to get gall bladder  Surgery as noted in past. Sleep ok on meds no other interference   Health Maintenance  Topic Date Due  . Hepatitis C Screening  01-15-1961  . HIV Screening  07/01/2016 (Originally 07/26/1975)  . INFLUENZA VACCINE  05/24/2016  . COLONOSCOPY  08/22/2018  . TETANUS/TDAP  08/04/2020   Health Maintenance Review LIFESTYLE:  Exercise:   Yes   Tobacco/ETS:  no Alcohol:    Very little  Sugar beverages:  Not daily  Sleep:  On ambien    10  Up at 8 hours  Drug use: no  ROS:  GEN/ HEENT: No fever, significant weight changes sweats headaches vision problems hearing changes, CV/ PULM; No chest pain shortness of breath cough, syncope,edema  change in exercise tolerance. GI /GU: No adominal pain, vomiting, change in bowel habits. No blood in the stool. No significant GU symptoms. SKIN/HEME: ,no acute skin rashes suspicious lesions or bleeding. No lymphadenopathy, nodules, masses.  NEURO/ PSYCH:  No neurologic signs such as weakness numbness. No depression anxiety.stress  Wife asking for divorce he doesn't wish  IMM/ Allergy: No unusual infections.  Allergy .   REST of 12 system review negative except as per HPI   Past Medical History  Diagnosis Date  . Bladder  cancer     RECURRENT (HX  TURBT  2013)  . History of colon polyps   . Cholelithiasis     ASYMPTOMATIC  . Diverticulosis of colon   . Lower urinary tract symptoms (LUTS)   . BPH (benign prostatic hyperplasia)   . Insomnia   . Wears glasses   . Borderline hypertension     Past Surgical History  Procedure Laterality Date  . Tonsillectomy  as child  . Hernia repair  as child    right inguinal  . Transurethral resection of bladder tumor  10/03/2012    Procedure: TRANSURETHRAL RESECTION OF BLADDER TUMOR (TURBT);  Surgeon: Alexis Frock, MD;  [facility];  Service: Urology;  Laterality: N/A;  . Cystoscopy with retrograde pyelogram, ureteroscopy and stent placement  10/03/2012    Procedure: [place], URETEROSCOPY AND STENT PLACEMENT;  Surgeon: Alexis Frock, MD;  Location: [facility];  Service: Urology;  Laterality: Left;    . Colonoscopy  2014  . Transurethral resection of bladder tumor with gyrus (turbt-gyrus) N/A 02/19/2014    Procedure: TRANSURETHRAL RESECTION OF BLADDER TUMOR WITH GYRUS (TURBT-GYRUS);  Surgeon: Alexis Frock, MD;  Location: [facility];  Service: Urology;  Laterality: N/A;  . Cystoscopy w/ retrogrades Bilateral 02/19/2014    Procedure: CYSTOSCOPY WITH RETROGRADE PYELOGRAM;  Surgeon: Alexis Frock,  MD;  Location: [place];  Service: Urology;  Laterality: Bilateral;  . Transurethral resection of bladder tumor with gyrus (turbt-gyrus) N/A 06/18/2014    Procedure: TRANSURETHRAL RESECTION OF BLADDER TUMOR WITH GYRUS (TURBT-GYRUS) WITH MITOMYCIN INSTILLATION;  Surgeon: Alexis Frock, MD;  Location: [facility];  Service: Urology;  Laterality: N/A;  . Cystoscopy w/ retrogrades Bilateral 06/18/2014    Procedure: CYSTOSCOPY WITH RETROGRADE PYELOGRAM;  Surgeon: Alexis Frock, MD;  Location: [facility];  Service: Urology;  Laterality: Bilateral;     Family History  Problem Relation Age of Onset  . Lung cancer Mother   . Colon cancer      fathers side   . ADD / ADHD Son     Social History   Social History  . Marital Status: Married    Spouse Name: N/A  . Number of Children: N/A  . Years of Education: N/A   Social History Main Topics  . Smoking status: Former Smoker -- 1.00 packs/day for 10 years    Types: Cigarettes    Quit date: 10/24/2001  . Smokeless tobacco: Never Used  . Alcohol Use: 1.2 oz/week    2 Standard drinks or equivalent per week  . Drug Use: No  . Sexual Activity: Not Asked   Other Topics Concern  . 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 EPINEPHrine (EPIPEN) 0.3 mg/0.3 mL DEVI Inject 0.3 mLs (0.3 mg total) into the muscle once. 1 Device 0  . oxyCODONE-acetaminophen (ROXICET) 5-325 MG per tablet Take 1-2 tablets by mouth every 6 (six) hours as needed for moderate pain or severe pain. Post-operaitvely 30 tablet 0  . zolpidem (AMBIEN) 10 MG tablet TAKE 1 TABLET AT BEDTIME AS NEEDED FOR SLEEP. 30 tablet 2  . cyclobenzaprine (FLEXERIL) 10 MG tablet TAKE 1 TABLET THREE TIMES DAILY AS NEEDED FOR MUSCLE SPASMS. (Patient not taking: Reported on 07/03/2015) 40 tablet 0  . HYDROcodone-acetaminophen (NORCO/VICODIN) 5-325 MG per tablet Take 1-2 tablets by mouth every 4 (four) hours as needed for moderate pain or severe pain. 20 tablet 0  . levofloxacin (LEVAQUIN) 500 MG tablet Take 1 tablet (500 mg total) by mouth daily. 7 tablet 0  . metroNIDAZOLE (FLAGYL) 500 MG tablet Take 1 tablet (500 mg total) by mouth 2 (two) times daily. 14 tablet 0  . pantoprazole (PROTONIX) 20 MG tablet Take 1 tablet (20 mg total) by mouth daily. 30 tablet 0  . senna-docusate (SENOKOT-S) 8.6-50 MG per tablet Take 1 tablet by mouth 2 (two) times daily.  While taking pain meds to prevent constipation. (Patient not taking: Reported on 11/03/2014) 30 tablet 0   No facility-administered medications prior to visit.     EXAM:  BP 150/90 mmHg  Temp(Src) 98.1 F (36.7 C) (Oral)  Ht 6' (1.829 m)  Wt 202 lb 8 oz (91.853 kg)  BMI 27.46 kg/m2  Body mass index is 27.46 kg/(m^2).  Physical Exam: Vital signs reviewed DXI:PJAS is a well-developed well-nourished alert cooperative    who appearsr stated age in no acute distress.   glasses HEENT: normocephalic atraumatic , Eyes: PERRL EOM's full, conjunctiva clear, Nares: paten,t no deformity discharge or tenderness., Ears: no deformity EAC's clear TMs with normal landmarks. Mouth: clear  OP, no lesions, edema.  Moist mucous membranes. Dentition in adequate repair. NECK: supple without masses, thyromegaly or bruits. CHEST/PULM:  Clear to auscultation and percussion breath sounds equal no wheeze , rales or rhonchi. No chest wall deformities or tenderness. CV: PMI is nondisplaced, S1 S2 no gallops, murmurs, rubs. Peripheral pulses are full without delay.No JVD .  ABDOMEN: Bowel sounds normal nontender  No guard or rebound, no hepato splenomegal no CVA tenderness.  No hernia. Extremtities:  No clubbing cyanosis or edema, no acute joint swelling or redness no focal atrophy NEURO:  Oriented x3, cranial nerves 3-12 appear to be intact, no obvious focal weakness,gait within normal limits no abnormal reflexes or asymmetrical SKIN: No acute rashes normal turgor, color, no bruising or petechiae.  Healing area of boil right arm  PSYCH: Oriented, good eye contact, no obvious depression anxiety, cognition and judgment appear normal. LN: no cervical axillary inguinal adenopathy  Lab Results  Component Value Date   WBC 7.6 05/29/2015   HGB 15.7 05/29/2015   HCT 45.5 05/29/2015   PLT 309.0 05/29/2015   GLUCOSE 93 05/29/2015   CHOL 156 05/29/2015   TRIG 106.0 05/29/2015   HDL 31.50* 05/29/2015   LDLDIRECT 120.3  07/21/2008   LDLCALC 103* 05/29/2015   ALT 16 05/29/2015   AST 14 05/29/2015   NA 141 05/29/2015   K 3.9 05/29/2015   CL 102 05/29/2015   CREATININE 1.01 05/29/2015   BUN 13 05/29/2015   CO2 29 05/29/2015   TSH 3.01 05/29/2015   PSA 3.83 05/29/2015   HGBA1C 5.1 11/20/2012    ASSESSMENT AND PLAN:  Discussed the following assessment and plan:  Visit for preventive health examination  Insomnia - reviewed risk benefit ok to cont for now  Low HDL (under 40) - back down again attention to ls  Rising PSA level - still in range but doubled in a year  recheck in a few months - Plan: PSA  Elevated BP - nl at home at this time continue to monitor  Need for prophylactic vaccination and inoculation against influenza - Plan: Flu Vaccine QUAD 36+ mos PF IM (Fluarix & Fluzone Quad PF) psa doubled from a year ago  Hep c hiv   Screen not done yet  Can do at that time  Just had cysto  Get  Repeat psa in 2+ months before next uro visit and will send result to dr Tresa Moore lrt inguinal hernia fatty no sx sometimes feels this  Have surgery check this out before having surgery for GS    Finding on ct   Esophagitis? Thickening no sx .   Also fu diverticulitis  Agree with GI assessment opinion  But he looks well without alarm sx today.  Patient Care Team: Burnis Medin, MD as PCP - General Alexis Frock, MD as Attending Physician (Urology) Patient Instructions  Continue lifestyle intervention healthy eating and exercise .  Recheck blood test PSA level in 2-3 months before your next urology appt.   Recheck if needed for stress anxiety.  Healthy lifestyle includes : At least 150 minutes of exercise weeks  , weight at healthy levels, which is usually   BMI 19-25. Avoid trans fats and processed foods;  Increase fresh fruits and veges to 5 servings per day. And avoid sweet beverages including tea and juice. Mediterranean diet with olive oil and nuts have been noted to be heart and brain healthy  . Avoid tobacco products . Limit  alcohol to  7 per week  for women and 14 servings for men.  Get adequate sleep . Wear seat belts . Don't text and drive .       Standley Brooking. Ambur Province M.D.

## 2015-07-09 ENCOUNTER — Other Ambulatory Visit: Payer: Self-pay | Admitting: Surgery

## 2015-07-09 NOTE — H&P (Signed)
[address] "Billy" Khy Gordon 01/05/2015 10:10 AM Location: [facility] Patient #: [id number] DOB: October 14, 1961 Married / Language: English / Race: White Male  History of Present Illness  Patient words: gallbladder.  The patient is a 54 year old male who presents for evaluation of gall stones. Patient sent by his primary care physician for concern of worsening gallbladder attacks. Pleasant active male. Has had intermittent upper abdominal pains with nausea in the past. Recalls an ultrasound many years ago that was abnormal. Had CAT scan done 2013 that confirmed cholelithiasis. Switch to a low-fat diet. That usually has worked. However the past few years he's had a more intense attack suspicious with heavier foods. He will get discomfort in his RIGHT upper quadrant. Some bloating. Some nausea. Never to the point of emesis. He does have mild heartburn but problems controls that. This is not like that. Because the attacks are happening more intensely and more easily, he is more open to consider surgery now. Patient has bowel movement once or twice a day. No history of IBS. No Crohn's. No ulcerative colitis. Colonoscopy with mild diverticulosis noted in 2014. Family history of colon polyp/cancer. He can exercise to miles without difficulty. He's never had any abdominal or hernia surgeries. In's. Did have a bladder cancer addressed by cystoscopy.   Other Problems [name], CMA; 01/05/2015 10:10 AM) Bladder Problems Cancer  Past Surgical History [name], CMA; 01/05/2015 10:10 AM) No pertinent past surgical history  Diagnostic Studies History [name], CMA; 01/05/2015 10:10 AM) Colonoscopy within last year  Allergies [name], CMA; 01/05/2015 10:11 AM) Penicillamine *ASSORTED CLASSES* Bee Pollen Plus Ginseng *ALTERNATIVE MEDICINES*  Medication History ([name], CMA; 01/05/2015 10:12 AM) Flexeril (10MG  Tablet, Oral as needed) Active. Zolpidem  Tartrate (10MG  Tablet, Oral as needed) Active. EpiPen 2-Pak (0.3 MG/0.3ML(1:1000) Device, Intramuscular as needed) Active. Medications Reconciled  Social History [name], CMA; 01/05/2015 10:10 AM) Alcohol use Occasional alcohol use. Caffeine use Coffee, Tea. No drug use Tobacco use Former smoker.  Family History [name], CMA; 01/05/2015 10:10 AM) Colon Polyps Father. Heart Disease Father. Respiratory Condition Mother.  Review of Systems [name] CMA; 01/05/2015 10:10 AM) General Not Present- Appetite Loss, Chills, Fatigue, Fever, Night Sweats, Weight Gain and Weight Loss. Skin Not Present- Change in Wart/Mole, Dryness, Hives, Jaundice, New Lesions, Non-Healing Wounds, Rash and Ulcer. HEENT Not Present- Earache, Hearing Loss, Hoarseness, Nose Bleed, Oral Ulcers, Ringing in the Ears, Seasonal Allergies, Sinus Pain, Sore Throat, Visual Disturbances, Wears glasses/contact lenses and Yellow Eyes. Respiratory Not Present- Bloody sputum, Chronic Cough, Difficulty Breathing, Snoring and Wheezing. Breast Not Present- Breast Mass, Breast Pain, Nipple Discharge and Skin Changes. Cardiovascular Not Present- Chest Pain, Difficulty Breathing Lying Down, Leg Cramps, Palpitations, Rapid Heart Rate, Shortness of Breath and Swelling of Extremities. Gastrointestinal Present- Bloating and Gets full quickly at meals. Not Present- Abdominal Pain, Bloody Stool, Change in Bowel Habits, Chronic diarrhea, Constipation, Difficulty Swallowing, Excessive gas, Hemorrhoids, Indigestion, Nausea, Rectal Pain and Vomiting. Male Genitourinary Not Present- Blood in Urine, Change in Urinary Stream, Frequency, Impotence, Nocturia, Painful Urination, Urgency and Urine Leakage. Musculoskeletal Not Present- Back Pain, Joint Pain, Joint Stiffness, Muscle Pain, Muscle Weakness and Swelling of Extremities. Neurological Not Present- Decreased Memory, Fainting, Headaches, Numbness, Seizures, Tingling, Tremor,  Trouble walking and Weakness. Psychiatric Not Present- Anxiety, Bipolar, Change in Sleep Pattern, Depression, Fearful and Frequent crying. Endocrine Not Present- Cold Intolerance, Excessive Hunger, Hair Changes, Heat Intolerance, Hot flashes and New Diabetes. Hematology Not Present- Easy Bruising, Excessive bleeding, Gland  problems, HIV and Persistent Infections.   Vitals ([name] CMA; 01/05/2015 10:11 AM) 01/05/2015 10:10 AM Weight: 212 lb Height: 73in Body Surface Area: 2.23 m Body Mass Index: 27.97 kg/m Temp.: 97.71F(Temporal)  Pulse: 77 (Regular)  BP: 134/78 (Sitting, Left Arm, Standard)    Physical Exam [name] MD; 01/05/2015 10:58 AM) General Mental Status-Alert. General Appearance-Not in acute distress, Not Sickly. Orientation-Oriented X3. Hydration-Well hydrated. Voice-Normal.  Integumentary Global Assessment Upon inspection and palpation of skin surfaces of the - Axillae: non-tender, no inflammation or ulceration, no drainage. and Distribution of scalp and body hair is normal. General Characteristics Temperature - normal warmth is noted.  Head and Neck Head-normocephalic, atraumatic with no lesions or palpable masses. Face Global Assessment - atraumatic, no absence of expression. Neck Global Assessment - no abnormal movements, no bruit auscultated on the right, no bruit auscultated on the left, no decreased range of motion, non-tender. Trachea-midline. Thyroid Gland Characteristics - non-tender.  Eye Eyeball - Left-Extraocular movements intact, No Nystagmus. Eyeball - Right-Extraocular movements intact, No Nystagmus. Cornea - Left-No Hazy. Cornea - Right-No Hazy. Sclera/Conjunctiva - Left-No scleral icterus, No Discharge. Sclera/Conjunctiva - Right-No scleral icterus, No Discharge. Pupil - Left-Direct reaction to light normal. Pupil - Right-Direct reaction to light normal.  ENMT Ears Pinna - Left - no  drainage observed, no generalized tenderness observed. Right - no drainage observed, no generalized tenderness observed. Nose and Sinuses External Inspection of the Nose - no destructive lesion observed. Inspection of the nares - Left - quiet respiration. Right - quiet respiration. Mouth and Throat Lips - Upper Lip - no fissures observed, no pallor noted. Lower Lip - no fissures observed, no pallor noted. Nasopharynx - no discharge present. Oral Cavity/Oropharynx - Tongue - no dryness observed. Oral Mucosa - no cyanosis observed. Hypopharynx - no evidence of airway distress observed.  Chest and Lung Exam Inspection Movements - Normal and Symmetrical. Accessory muscles - No use of accessory muscles in breathing. Palpation Palpation of the chest reveals - Non-tender. Auscultation Breath sounds - Normal and Clear.  Cardiovascular Auscultation Rhythm - Regular. Murmurs & Other Heart Sounds - Auscultation of the heart reveals - No Murmurs and No Systolic Clicks.  Abdomen Inspection Inspection of the abdomen reveals - No Visible peristalsis and No Abnormal pulsations. Umbilicus - No Bleeding, No Urine drainage. Palpation/Percussion Palpation and Percussion of the abdomen reveal - Soft, Non Tender, No Rebound tenderness, No Rigidity (guarding) and No Cutaneous hyperesthesia. Note: Mild soreness RIGHT upper quadrant. No true Murphy sign. No diastases. Perhaps small 5 mm umbilical hernia through stalk only.   Male Genitourinary Sexual Maturity Tanner 5 - Adult hair pattern and Adult penile size and shape. Note: Circumcised male. Testes epididymides cords normal. No inguinal hernias.   Peripheral Vascular Upper Extremity Inspection - Left - No Cyanotic nailbeds, Not Ischemic. Right - No Cyanotic nailbeds, Not Ischemic.  Neurologic Neurologic evaluation reveals -normal attention span and ability to concentrate, able to name objects and repeat phrases. Appropriate fund of knowledge ,  normal sensation and normal coordination. Mental Status Affect - not angry, not paranoid. Cranial Nerves-Normal Bilaterally. Gait-Normal.  Neuropsychiatric Mental status exam performed with findings of-able to articulate well with normal speech/language, rate, volume and coherence, thought content normal with ability to perform basic computations and apply abstract reasoning and no evidence of hallucinations, delusions, obsessions or homicidal/suicidal ideation.  Musculoskeletal Global Assessment Spine, Ribs and Pelvis - no instability, subluxation or laxity. Right Upper Extremity - no instability, subluxation or laxity.  Lymphatic  Head & Neck  General Head & Neck Lymphatics: Bilateral - Description - No Localized lymphadenopathy. Axillary  General Axillary Region: Bilateral - Description - No Localized lymphadenopathy. Femoral & Inguinal  Generalized Femoral & Inguinal Lymphatics: Left - Description - No Localized lymphadenopathy. Right - Description - No Localized lymphadenopathy.    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 The anatomy & physiology of hepatobiliary & pancreatic function was discussed. The pathophysiology of gallbladder dysfunction was discussed. Natural history risks without surgery was discussed. I feel the risks of no intervention will lead to serious problems that outweigh the operative risks; therefore, I recommended cholecystectomy to remove the pathology. I explained laparoscopic techniques with possible need for an open approach. Probable cholangiogram to evaluate the bilary tract was explained as well.  Risks such as bleeding, infection, abscess, leak, injury to other organs, need for further treatment, heart attack, death, and other risks were discussed. I noted a good  likelihood this will help address the problem. Possibility that this will not correct all abdominal symptoms was explained. Goals of post-operative recovery were discussed as well. We will work to minimize complications. An educational handout further explaining the pathology and treatment options was given as well. Questions were answered. The patient expresses understanding & wishes to proceed with surgery. Pt Education - [facility] Laparosopic Post Op HCI (Catcher Dehoyos) Pt Education - [facility] Pain Control (Letishia Elliott) Pt Education - [facility] Good Bowel Health (Lynx Goodrich)  [name], M.D., F.A.C.S. Gastrointestinal and Minimally Invasive Surgery [facility] [address] [place], [place] [address] [phone] Main / Paging

## 2015-08-18 ENCOUNTER — Other Ambulatory Visit (HOSPITAL_COMMUNITY): Payer: Self-pay | Admitting: *Deleted

## 2015-08-18 NOTE — Patient Instructions (Addendum)
[name]  08/18/2015   Your procedure is scheduled on:  08-20-15  Report to [facility] Main  Entrance take [facility]  elevators to 3rd floor to  [place] at 1230 PM  Call this number if you have problems the morning of surgery [phone]   Remember: ONLY 1 PERSON MAY GO WITH YOU TO SHORT STAY TO GET  READY MORNING OF YOUR SURGERY.  Do not eat food  :After Midnight, clea liquids midnight until 830 am, nothing by mouth after 830 am.      Take these medicines the morning of surgery with A SIP OF WATER: none              You may not have any metal on your body including hair pins and              piercings  Do not wear jewelry, make-up, lotions, powders or perfumes, deodorant             Do not wear nail polish.  Do not shave  48 hours prior to surgery.              Men may shave face and neck.   Do not bring valuables to the hospital. [place].  Contacts, dentures or bridgework may not be worn into surgery.  Leave suitcase in the car. After surgery it may be brought to your room.     Patients discharged the day of surgery will not be allowed to drive home.  Name and phone number of your driver: amy Christoffersen wife cell [phone]  Special Instructions: N/A              Please read over the following fact sheets you were given: _____________________________________________________________________                CLEAR LIQUID DIET   Foods Allowed                                                                     Foods Excluded  Coffee and tea, regular and decaf                             liquids that you cannot  Plain Jell-O in any flavor                                             see through such as: Fruit ices (not with fruit pulp)                                     milk, soups, orange juice  Iced Popsicles  All solid food Carbonated beverages, regular  and diet                                    Cranberry, grape and apple juices Sports drinks like Gatorade Lightly seasoned clear broth or consume(fat free) Sugar, honey syrup  Sample Menu Breakfast                                Lunch                                     Supper Cranberry juice                    Beef broth                            Chicken broth Jell-O                                     Grape juice                           Apple juice Coffee or tea                        Jell-O                                      Popsicle                                                Coffee or tea                        Coffee or tea  _____________________________________________________________________  [facility] - Preparing for Surgery Before surgery, you can play an important role.  Because skin is not sterile, your skin needs to be as free of germs as possible.  You can reduce the number of germs on your skin by washing with CHG (chlorahexidine gluconate) soap before surgery.  CHG is an antiseptic cleaner which kills germs and bonds with the skin to continue killing germs even after washing. Please DO NOT use if you have an allergy to CHG or antibacterial soaps.  If your skin becomes reddened/irritated stop using the CHG and inform your nurse when you arrive at Short Stay. Do not shave (including legs and underarms) for at least 48 hours prior to the first CHG shower.  You may shave your face/neck. Please follow these instructions carefully:  1.  Shower with CHG Soap the night before surgery and the  morning of Surgery.  2.  If you choose to wash your hair, wash your hair first as usual with your  normal  shampoo.  3.  After you shampoo, rinse your hair and body thoroughly to remove the  shampoo.  4.  Use CHG as you would any other liquid soap.  You can apply chg directly  to the skin and wash                       Gently with a scrungie or clean  washcloth.  5.  Apply the CHG Soap to your body ONLY FROM THE NECK DOWN.   Do not use on face/ open                           Wound or open sores. Avoid contact with eyes, ears mouth and genitals (private parts).                       Wash face,  Genitals (private parts) with your normal soap.             6.  Wash thoroughly, paying special attention to the area where your surgery  will be performed.  7.  Thoroughly rinse your body with warm water from the neck down.  8.  DO NOT shower/wash with your normal soap after using and rinsing off  the CHG Soap.                9.  Pat yourself dry with a clean towel.            10.  Wear clean pajamas.            11.  Place clean sheets on your bed the night of your first shower and do not  sleep with pets. Day of Surgery : Do not apply any lotions/deodorants the morning of surgery.  Please wear clean clothes to the hospital/surgery center.  FAILURE TO FOLLOW THESE INSTRUCTIONS MAY RESULT IN THE CANCELLATION OF YOUR SURGERY PATIENT SIGNATURE_________________________________  NURSE SIGNATURE__________________________________  ________________________________________________________________________

## 2015-08-18 NOTE — Progress Notes (Signed)
ekg 03-15-15 epic

## 2015-08-19 ENCOUNTER — Encounter (INDEPENDENT_AMBULATORY_CARE_PROVIDER_SITE_OTHER): Payer: Self-pay

## 2015-08-19 ENCOUNTER — Encounter (HOSPITAL_COMMUNITY): Payer: Self-pay

## 2015-08-19 ENCOUNTER — Encounter (HOSPITAL_COMMUNITY)
Admission: RE | Admit: 2015-08-19 | Discharge: 2015-08-19 | Disposition: A | Discharge status: Home / Self Care | Payer: Commercial Managed Care - HMO | Source: Ambulatory Visit | Attending: Surgery | Admitting: Surgery

## 2015-08-19 DIAGNOSIS — Z79899 Other long term (current) drug therapy: Secondary | ICD-10-CM | POA: Diagnosis not present

## 2015-08-19 DIAGNOSIS — Z8551 Personal history of malignant neoplasm of bladder: Secondary | ICD-10-CM | POA: Diagnosis not present

## 2015-08-19 DIAGNOSIS — K8010 Calculus of gallbladder with chronic cholecystitis without obstruction: Secondary | ICD-10-CM | POA: Diagnosis present

## 2015-08-19 DIAGNOSIS — Z87891 Personal history of nicotine dependence: Secondary | ICD-10-CM | POA: Diagnosis not present

## 2015-08-19 HISTORY — DX: Unilateral inguinal hernia, without obstruction or gangrene, not specified as recurrent: K40.90

## 2015-08-19 HISTORY — DX: Other skin changes: R23.8

## 2015-08-19 LAB — CBC
HEMATOCRIT: 42.2 % (ref 39.0–52.0)
Hemoglobin: 14.9 g/dL (ref 13.0–17.0)
MCH: 31.2 pg (ref 26.0–34.0)
MCHC: 35.3 g/dL (ref 30.0–36.0)
MCV: 88.5 fL (ref 78.0–100.0)
Platelets: 225 10*3/uL (ref 150–400)
RBC: 4.77 MIL/uL (ref 4.22–5.81)
RDW: 13.3 % (ref 11.5–15.5)
WBC: 9.7 10*3/uL (ref 4.0–10.5)

## 2015-08-20 ENCOUNTER — Encounter (HOSPITAL_COMMUNITY)
Admission: RE | Disposition: A | Discharge status: Home / Self Care | Payer: Self-pay | Source: Ambulatory Visit | Attending: Surgery

## 2015-08-20 ENCOUNTER — Ambulatory Visit (HOSPITAL_COMMUNITY): Payer: Commercial Managed Care - HMO | Admitting: Anesthesiology

## 2015-08-20 ENCOUNTER — Ambulatory Visit (HOSPITAL_COMMUNITY)
Admission: RE | Admit: 2015-08-20 | Discharge: 2015-08-20 | Disposition: A | Discharge status: Home / Self Care | Payer: Commercial Managed Care - HMO | Source: Ambulatory Visit | Attending: Surgery | Admitting: Surgery

## 2015-08-20 ENCOUNTER — Encounter (HOSPITAL_COMMUNITY): Payer: Self-pay | Admitting: *Deleted

## 2015-08-20 ENCOUNTER — Ambulatory Visit (HOSPITAL_COMMUNITY): Payer: Commercial Managed Care - HMO

## 2015-08-20 DIAGNOSIS — K8010 Calculus of gallbladder with chronic cholecystitis without obstruction: Secondary | ICD-10-CM | POA: Insufficient documentation

## 2015-08-20 DIAGNOSIS — Z79899 Other long term (current) drug therapy: Secondary | ICD-10-CM | POA: Insufficient documentation

## 2015-08-20 DIAGNOSIS — Z8551 Personal history of malignant neoplasm of bladder: Secondary | ICD-10-CM | POA: Insufficient documentation

## 2015-08-20 DIAGNOSIS — Z419 Encounter for procedure for purposes other than remedying health state, unspecified: Secondary | ICD-10-CM

## 2015-08-20 DIAGNOSIS — Z87891 Personal history of nicotine dependence: Secondary | ICD-10-CM | POA: Insufficient documentation

## 2015-08-20 HISTORY — PX: LAPAROSCOPIC CHOLECYSTECTOMY SINGLE SITE WITH INTRAOPERATIVE CHOLANGIOGRAM: SHX6538

## 2015-08-20 SURGERY — LAPAROSCOPIC CHOLECYSTECTOMY SINGLE SITE WITH INTRAOPERATIVE CHOLANGIOGRAM
Anesthesia: General

## 2015-08-20 MED ORDER — HYDROMORPHONE HCL 1 MG/ML IJ SOLN
INTRAMUSCULAR | Status: DC | PRN
  Administered 2015-08-20: .2 mg via INTRAVENOUS
  Administered 2015-08-20: .6 mg via INTRAVENOUS
  Administered 2015-08-20: .2 mg via INTRAVENOUS

## 2015-08-20 MED ORDER — OXYCODONE HCL 5 MG PO TABS: 5.0000 mg | ORAL_TABLET | ORAL | Status: DC | PRN

## 2015-08-20 MED ORDER — KETOROLAC TROMETHAMINE 30 MG/ML IJ SOLN
INTRAMUSCULAR | Status: DC | PRN
  Administered 2015-08-20: 30 mg via INTRAVENOUS

## 2015-08-20 MED ORDER — PHENYLEPHRINE HCL 10 MG/ML IJ SOLN
INTRAMUSCULAR | Status: DC | PRN
  Administered 2015-08-20: 80 ug via INTRAVENOUS

## 2015-08-20 MED ORDER — ONDANSETRON HCL 4 MG/2ML IJ SOLN
INTRAMUSCULAR | Status: DC | PRN
  Administered 2015-08-20: 4 mg via INTRAVENOUS

## 2015-08-20 MED ORDER — SUGAMMADEX SODIUM 200 MG/2ML IV SOLN
INTRAVENOUS | Status: DC | PRN
  Administered 2015-08-20: 364.8 mg via INTRAVENOUS

## 2015-08-20 MED ORDER — ONDANSETRON HCL 4 MG/2ML IJ SOLN
INTRAMUSCULAR | Status: AC
  Filled 2015-08-20: qty 2

## 2015-08-20 MED ORDER — HYDROMORPHONE HCL 2 MG/ML IJ SOLN
INTRAMUSCULAR | Status: AC
  Filled 2015-08-20: qty 1

## 2015-08-20 MED ORDER — DEXAMETHASONE SODIUM PHOSPHATE 10 MG/ML IJ SOLN
INTRAMUSCULAR | Status: AC
  Filled 2015-08-20: qty 1

## 2015-08-20 MED ORDER — ROCURONIUM BROMIDE 100 MG/10ML IV SOLN
INTRAVENOUS | Status: AC
  Filled 2015-08-20: qty 1

## 2015-08-20 MED ORDER — BUPIVACAINE-EPINEPHRINE 0.25% -1:200000 IJ SOLN
INTRAMUSCULAR | Status: AC
  Filled 2015-08-20: qty 2

## 2015-08-20 MED ORDER — MIDAZOLAM HCL 2 MG/2ML IJ SOLN
INTRAMUSCULAR | Status: AC
  Filled 2015-08-20: qty 4

## 2015-08-20 MED ORDER — FENTANYL CITRATE (PF) 100 MCG/2ML IJ SOLN: 25.0000 ug | INTRAMUSCULAR | Status: DC | PRN

## 2015-08-20 MED ORDER — LACTATED RINGERS IV SOLN
INTRAVENOUS | Status: DC | PRN
  Administered 2015-08-20: 15:00:00 via INTRAVENOUS

## 2015-08-20 MED ORDER — HYDROMORPHONE HCL 1 MG/ML IJ SOLN
0.2500 mg | INTRAMUSCULAR | Status: DC | PRN
  Administered 2015-08-20 (×4): 0.5 mg via INTRAVENOUS

## 2015-08-20 MED ORDER — FENTANYL CITRATE (PF) 100 MCG/2ML IJ SOLN
INTRAMUSCULAR | Status: DC | PRN
  Administered 2015-08-20 (×5): 50 ug via INTRAVENOUS

## 2015-08-20 MED ORDER — PROPOFOL 10 MG/ML IV BOLUS
INTRAVENOUS | Status: AC
  Filled 2015-08-20: qty 20

## 2015-08-20 MED ORDER — LIDOCAINE HCL (CARDIAC) 20 MG/ML IV SOLN
INTRAVENOUS | Status: DC | PRN
  Administered 2015-08-20: 100 mg via INTRAVENOUS

## 2015-08-20 MED ORDER — KETOROLAC TROMETHAMINE 30 MG/ML IJ SOLN
INTRAMUSCULAR | Status: AC
  Filled 2015-08-20: qty 1

## 2015-08-20 MED ORDER — SODIUM CHLORIDE 0.9 % IV SOLN
250.0000 mL | INTRAVENOUS | Status: DC | PRN
Start: 2015-08-20 — End: 2015-08-20

## 2015-08-20 MED ORDER — LIDOCAINE HCL (CARDIAC) 20 MG/ML IV SOLN
INTRAVENOUS | Status: AC
  Filled 2015-08-20: qty 5

## 2015-08-20 MED ORDER — DEXAMETHASONE SODIUM PHOSPHATE 10 MG/ML IJ SOLN
INTRAMUSCULAR | Status: DC | PRN
Start: 2015-08-20 — End: 2015-08-20
  Administered 2015-08-20: 10 mg via INTRAVENOUS

## 2015-08-20 MED ORDER — FENTANYL CITRATE (PF) 250 MCG/5ML IJ SOLN
INTRAMUSCULAR | Status: AC
  Filled 2015-08-20: qty 25

## 2015-08-20 MED ORDER — HYDROMORPHONE HCL 1 MG/ML IJ SOLN
INTRAMUSCULAR | Status: AC
  Filled 2015-08-20: qty 1

## 2015-08-20 MED ORDER — SUGAMMADEX SODIUM 500 MG/5ML IV SOLN
INTRAVENOUS | Status: AC
  Filled 2015-08-20: qty 5

## 2015-08-20 MED ORDER — MIDAZOLAM HCL 2 MG/2ML IJ SOLN
INTRAMUSCULAR | Status: DC | PRN
  Administered 2015-08-20: 2 mg via INTRAVENOUS

## 2015-08-20 MED ORDER — PROPOFOL 10 MG/ML IV BOLUS
INTRAVENOUS | Status: DC | PRN
  Administered 2015-08-20: 200 mg via INTRAVENOUS

## 2015-08-20 MED ORDER — SUCCINYLCHOLINE CHLORIDE 20 MG/ML IJ SOLN
INTRAMUSCULAR | Status: DC | PRN
  Administered 2015-08-20: 100 mg via INTRAVENOUS

## 2015-08-20 MED ORDER — IOHEXOL 300 MG/ML  SOLN
INTRAMUSCULAR | Status: DC | PRN
  Administered 2015-08-20: 7 mL

## 2015-08-20 MED ORDER — ACETAMINOPHEN 500 MG PO TABS
1000.0000 mg | ORAL_TABLET | Freq: Three times a day (TID) | ORAL | Status: DC
  Filled 2015-08-20 (×3): qty 2

## 2015-08-20 MED ORDER — SODIUM CHLORIDE 0.9 % IJ SOLN
INTRAMUSCULAR | Status: AC
  Filled 2015-08-20: qty 10

## 2015-08-20 MED ORDER — LACTATED RINGERS IV SOLN
INTRAVENOUS | Status: DC
  Administered 2015-08-20: 17:00:00 via INTRAVENOUS

## 2015-08-20 MED ORDER — SODIUM CHLORIDE 0.9 % IJ SOLN: 3.0000 mL | Freq: Two times a day (BID) | INTRAMUSCULAR | Status: DC

## 2015-08-20 MED ORDER — SODIUM CHLORIDE 0.9 % IJ SOLN: 3.0000 mL | INTRAMUSCULAR | Status: DC | PRN

## 2015-08-20 MED ORDER — BUPIVACAINE-EPINEPHRINE 0.25% -1:200000 IJ SOLN
INTRAMUSCULAR | Status: DC | PRN
  Administered 2015-08-20: 60 mL

## 2015-08-20 MED ORDER — ROCURONIUM BROMIDE 100 MG/10ML IV SOLN
INTRAVENOUS | Status: DC | PRN
  Administered 2015-08-20: 10 mg via INTRAVENOUS
  Administered 2015-08-20: 40 mg via INTRAVENOUS

## 2015-08-20 MED ORDER — LACTATED RINGERS IR SOLN
Status: DC | PRN
  Administered 2015-08-20: 1000 mL

## 2015-08-20 SURGICAL SUPPLY — 42 items
APPLIER CLIP 5 13 M/L LIGAMAX5 (MISCELLANEOUS) ×3
CABLE HIGH FREQUENCY MONO STRZ (ELECTRODE) ×3 IMPLANT
CHLORAPREP W/TINT 26ML (MISCELLANEOUS) ×3 IMPLANT
CLIP APPLIE 5 13 M/L LIGAMAX5 (MISCELLANEOUS) ×1 IMPLANT
COVER MAYO STAND STRL (DRAPES) ×3 IMPLANT
COVER SURGICAL LIGHT HANDLE (MISCELLANEOUS) ×3 IMPLANT
DECANTER SPIKE VIAL GLASS SM (MISCELLANEOUS) ×3 IMPLANT
DRAIN CHANNEL 19F RND (DRAIN) IMPLANT
DRAPE C-ARM 42X120 X-RAY (DRAPES) ×3 IMPLANT
DRAPE LAPAROSCOPIC ABDOMINAL (DRAPES) ×3 IMPLANT
DRAPE WARM FLUID 44X44 (DRAPE) ×3 IMPLANT
DRSG TEGADERM 4X4.75 (GAUZE/BANDAGES/DRESSINGS) ×3 IMPLANT
ELECT REM PT RETURN 9FT ADLT (ELECTROSURGICAL) ×3
ELECTRODE REM PT RTRN 9FT ADLT (ELECTROSURGICAL) ×1 IMPLANT
ENDOLOOP SUT PDS II  0 18 (SUTURE)
ENDOLOOP SUT PDS II 0 18 (SUTURE) IMPLANT
EVACUATOR SILICONE 100CC (DRAIN) IMPLANT
GAUZE SPONGE 2X2 8PLY STRL LF (GAUZE/BANDAGES/DRESSINGS) ×1 IMPLANT
GLOVE BIOGEL PI IND STRL 7.0 (GLOVE) ×2 IMPLANT
GLOVE BIOGEL PI INDICATOR 7.0 (GLOVE) ×4
GLOVE ECLIPSE 8.0 STRL XLNG CF (GLOVE) ×3 IMPLANT
GLOVE INDICATOR 8.0 STRL GRN (GLOVE) ×3 IMPLANT
GLOVE SURG SS PI 7.0 STRL IVOR (GLOVE) ×3 IMPLANT
GOWN STRL REUS W/TWL XL LVL3 (GOWN DISPOSABLE) ×6 IMPLANT
KIT BASIN OR (CUSTOM PROCEDURE TRAY) ×3 IMPLANT
NS IRRIG 1000ML POUR BTL (IV SOLUTION) ×3 IMPLANT
PAD POSITIONING PINK XL (MISCELLANEOUS) ×3 IMPLANT
PEN SKIN MARKING BROAD (MISCELLANEOUS) ×3 IMPLANT
POUCH SPECIMEN RETRIEVAL 10MM (ENDOMECHANICALS) ×3 IMPLANT
SCISSORS LAP 5X35 DISP (ENDOMECHANICALS) ×3 IMPLANT
SET CHOLANGIOGRAPH MIX (MISCELLANEOUS) ×3 IMPLANT
SET IRRIG TUBING LAPAROSCOPIC (IRRIGATION / IRRIGATOR) ×3 IMPLANT
SHEARS HARMONIC ACE PLUS 36CM (ENDOMECHANICALS) ×3 IMPLANT
SPONGE GAUZE 2X2 STER 10/PKG (GAUZE/BANDAGES/DRESSINGS) ×2
SUT MNCRL AB 4-0 PS2 18 (SUTURE) ×3 IMPLANT
SUT PDS AB 1 CT1 27 (SUTURE) ×6 IMPLANT
SYR 20CC LL (SYRINGE) ×3 IMPLANT
TOWEL OR 17X26 10 PK STRL BLUE (TOWEL DISPOSABLE) ×3 IMPLANT
TOWEL OR NON WOVEN STRL DISP B (DISPOSABLE) IMPLANT
TRAY LAPAROSCOPIC (CUSTOM PROCEDURE TRAY) ×3 IMPLANT
TROCAR BLADELESS OPT 5 100 (ENDOMECHANICALS) ×3 IMPLANT
TROCAR BLADELESS OPT 5 150 (ENDOMECHANICALS) ×3 IMPLANT

## 2015-08-20 NOTE — Anesthesia Postprocedure Evaluation (Signed)
  Anesthesia Post-op Note  Patient: [name]  Procedure(s) Performed: Procedure(s) (LRB): LAPAROSCOPIC CHOLECYSTECTOMY SINGLE SITE WITH INTRAOPERATIVE CHOLANGIOGRAM (N/A)  Patient Location: PACU  Anesthesia Type: General  Level of Consciousness: awake and alert   Airway and Oxygen Therapy: Patient Spontanous Breathing  Post-op Pain: mild  Post-op Assessment: Post-op Vital signs reviewed, Patient's Cardiovascular Status Stable, Respiratory Function Stable, Patent Airway and No signs of Nausea or vomiting  Last Vitals:  Filed Vitals:   08/20/15 1752  BP: 118/77  Pulse: 69  Temp: 36.7 C  Resp: 16    Post-op Vital Signs: stable   Complications: No apparent anesthesia complications

## 2015-08-20 NOTE — Transfer of Care (Signed)
Immediate Anesthesia Transfer of Care Note  Patient: [name]  Procedure(s) Performed: Procedure(s): LAPAROSCOPIC CHOLECYSTECTOMY SINGLE SITE WITH INTRAOPERATIVE CHOLANGIOGRAM (N/A)  Patient Location: PACU  Anesthesia Type:General  Level of Consciousness: awake  Airway & Oxygen Therapy: Patient Spontanous Breathing and Patient connected to face mask oxygen  Post-op Assessment: Report given to RN and Post -op Vital signs reviewed and stable  Post vital signs: Reviewed and stable  Last Vitals:  Filed Vitals:   08/20/15 1245  BP: 127/87  Pulse: 71  Temp: 36.7 C  Resp: 16    Complications: No apparent anesthesia complications

## 2015-08-20 NOTE — Op Note (Signed)
08/20/2015  4:30 PM  PATIENT:  [name]  54 y.o. male  Patient Care Team: Burnis Medin, MD as PCP - General Alexis Frock, MD as Attending Physician (Urology)  PRE-OPERATIVE DIAGNOSIS:  chronic cholecystitis  POST-OPERATIVE DIAGNOSIS:  chronic cholecystitis  PROCEDURE:  Procedure(s): LAPAROSCOPIC CHOLECYSTECTOMY SINGLE SITE WITH INTRAOPERATIVE CHOLANGIOGRAM  SURGEON:  Surgeon(s): Michael Boston, MD  ASSISTANT: RNFA   ANESTHESIA:   local and general  EBL:  Total I/O In: 750 [I.V.:750] Out: 25 [Blood:25]  Delay start of Pharmacological VTE agent (>24hrs) due to surgical blood loss or risk of bleeding:  no  DRAINS:  none   SPECIMEN:  Source of Specimen:   Gallbladder   DISPOSITION OF SPECIMEN:  PATHOLOGY  COUNTS:  YES  PLAN OF CARE: Discharge to home after PACU  PATIENT DISPOSITION:  PACU - hemodynamically stable.  INDICATION: Pleasant patient with classic signs of biliary colic suspicious for chronic cholecystitis.  Rest of the differential diagnosis seems unlikely.  I offered cholecystectomy.  The anatomy & physiology of hepatobiliary & pancreatic function was discussed.  The pathophysiology of gallbladder dysfunction was discussed.  Natural history risks without surgery was discussed.   I feel the risks of no intervention will lead to serious problems that outweigh the operative risks; therefore, I recommended cholecystectomy to remove the pathology.  I explained laparoscopic techniques with possible need for an open approach.  Probable cholangiogram to evaluate the bilary tract was explained as well.    Risks such as bleeding, infection, abscess, leak, injury to other organs, need for further treatment, heart attack, death, and other risks were discussed.  I noted a good likelihood this will help address the problem.  Possibility that this will not correct all abdominal symptoms was explained.  Goals of post-operative recovery were discussed as well.  We will  work to minimize complications.  An educational handout further explaining the pathology and treatment options was given as well.  Questions were answered.  The patient expresses understanding & wishes to proceed with surgery.   OR FINDINGS: Very long stretch of gallbladder with some thickening.  3 large gallstones.  Concern for chronic cholecystitis.  Normal biliary system without evidence of stricture or obstruction or choledocholithiasis.  No leak.  DESCRIPTION:   The patient was identified & brought in the operating room. The patient was positioned supine with arms tucked. SCDs were active during the entire case. The patient underwent general anesthesia without any difficulty.  The abdomen was prepped and draped in a sterile fashion. A Surgical Timeout confirmed our plan.  I made a transverse curvilinear incision through the superior umbilical fold.  I placed a 4mm long port through the supraumbilical fascia using a modified Hassan cutdown technique. I began carbon dioxide insufflation. Camera inspection revealed no injury. There were no adhesions to the anterior abdominal wall supraumbilically.  I proceeded to continue with single site technique. I placed a #5 port in left upper aspect of the wound. I placed a 5 mm atraumatic grasper in the right inferior aspect of the wound.  I turned attention to the right upper quadrant.  There are moderate adhesions of greater omentum to the gallbladder consistent with prior cholecystitis.  Carefully elevated the gallbladder and transected the greater omentum attachments off the gallbladder to expose the fundus.  The gallbladder fundus was elevated cephalad.  I completed freeing adhesions of greater omentum colon and duodenal sweep off the gallbladder and infundibulum.  I freed the peritoneal coverings between the gallbladder and the  liver on the posteriolateral and anteriomedial walls. I alternated between Harmonic & blunt Maryland dissection to help get a  good critical view of the cystic artery and cystic duct. I did further dissection to free a few centimeters of the  gallbladder off the liver bed to get a good critical view of the infundibulum and cystic duct. I mobilized the cystic artery; and, after getting a good 360 view, ligated the cystic artery using the Harmonic ultrasonic dissection. I skeletonized the cystic duct.  I placed a clip on the infundibulum. I did a partial cystic duct-otomy and ensured patency. I placed a 5 [place] cholangiocatheter through a puncture site at the right subcostal ridge of the abdominal wall and directed it into the cystic duct.  We ran a cholangiogram with dilute radio-opaque contrast and continuous fluoroscopy. Contrast flowed from a side branch consistent with cystic duct cannulization. Contrast flowed up the common hepatic duct into the right and left intrahepatic chains out to secondary radicals. Contrast flowed down the common bile duct easily across the normal ampulla into the duodenum.  This was consistent with a normal cholangiogram.  I removed the cholangiocatheter. I placed clips on the cystic duct x4.  I completed cystic duct transection. I freed the gallbladder from its remaining attachments to the liver. I ensured hemostasis on the gallbladder fossa of the liver and elsewhere. I inspected the rest of the abdomen & detected no injury nor bleeding elsewhere.  I placed the gallbladder inside an Endo Catch bag.  I removed the gallbladder out the supraumbilical fascia.  I did open it up to 2.5 cm given the very large gallstones.  I closed the fascia transversely using #1 PDS transverse interrupted stitches. A closed the skin using 4-0 monocryl stitch.  Sterile dressing was applied. The patient was extubated & arrived in the PACU in stable condition..  I had discussed postoperative care with the patient in the holding area.  I am about to locate the patient's family and discuss operative findings and  postoperative goals / instructions.  Instructions are written in the chart as well.  [name], M.D., F.A.C.S. Gastrointestinal and Minimally Invasive Surgery [facility] [address] [place], [place] [id number] [phone] Main / Paging

## 2015-08-20 NOTE — Anesthesia Procedure Notes (Signed)
Procedure Name: Intubation Date/Time: 08/20/2015 3:13 PM Performed by: Danley Danker L Pre-anesthesia Checklist: Suction available, Patient being monitored, Emergency Drugs available and Patient identified Patient Re-evaluated:Patient Re-evaluated prior to inductionOxygen Delivery Method: Circle system utilized Preoxygenation: Pre-oxygenation with 100% oxygen Intubation Type: IV induction Laryngoscope Size: Mac and 4 Grade View: Grade I Tube type: Oral Tube size: 8.0 mm Number of attempts: 1 Airway Equipment and Method: Stylet Placement Confirmation: ETT inserted through vocal cords under direct vision,  positive ETCO2 and breath sounds checked- equal and bilateral Secured at: 22 cm Tube secured with: Tape Dental Injury: Teeth and Oropharynx as per pre-operative assessment

## 2015-08-20 NOTE — Anesthesia Preprocedure Evaluation (Signed)
Anesthesia Evaluation  Patient identified by MRN, date of birth, ID band Patient awake    Reviewed: Allergy & Precautions, H&P , NPO status , Patient's Chart, lab work & pertinent test results  Airway Mallampati: II  TM Distance: >3 FB Neck ROM: full    Dental no notable dental hx. (+) Dental Advisory Given   Pulmonary neg pulmonary ROS, former smoker,    Pulmonary exam normal breath sounds clear to auscultation       Cardiovascular Exercise Tolerance: Good negative cardio ROS Normal cardiovascular exam Rhythm:regular Rate:Normal     Neuro/Psych negative neurological ROS  negative psych ROS   GI/Hepatic negative GI ROS, Neg liver ROS,   Endo/Other  negative endocrine ROS  Renal/GU negative Renal ROS  negative genitourinary   Musculoskeletal   Abdominal   Peds  Hematology negative hematology ROS (+)   Anesthesia Other Findings   Reproductive/Obstetrics negative OB ROS                             Anesthesia Physical Anesthesia Plan  ASA: II  Anesthesia Plan: General   Post-op Pain Management:    Induction: Intravenous  Airway Management Planned: Oral ETT  Additional Equipment:   Intra-op Plan:   Post-operative Plan: Extubation in OR  Informed Consent: I have reviewed the patients History and Physical, chart, labs and discussed the procedure including the risks, benefits and alternatives for the proposed anesthesia with the patient or authorized representative who has indicated his/her understanding and acceptance.   Dental Advisory Given  Plan Discussed with: CRNA and Surgeon  Anesthesia Plan Comments:         Anesthesia Quick Evaluation

## 2015-08-20 NOTE — Interval H&P Note (Signed)
History and Physical Interval Note:  08/20/2015 2:24 PM  [name]  has presented today for surgery, with the diagnosis of Biliary Colic  The various methods of treatment have been discussed with the patient and family. After consideration of risks, benefits and other options for treatment, the patient has consented to  Procedure(s): [place] CHOLANGIOGRAM (N/A) as a surgical intervention .  The patient's history has been reviewed, patient examined, no change in status, stable for surgery.  I have reviewed the patient's chart and labs.  Questions were answered to the patient's satisfaction.     Hicks Feick C.

## 2015-08-20 NOTE — H&P (Signed)
[address] "Billy" Khizar Gordon 01/05/2015 10:10 AM Location: [facility] Patient #: [id number] DOB: Mar 19, 1961 Married / Language: English / Race: White Male   History of Present Illness  Patient words: gallbladder.  The patient is a 54 year old male who presents for evaluation of gall stones. Patient sent by his primary care physician for concern of worsening gallbladder attacks. Pleasant active male. Has had intermittent upper abdominal pains with nausea in the past. Recalls an ultrasound many years ago that was abnormal. Had CAT scan done 2013 that confirmed cholelithiasis. Switch to a low-fat diet. That usually has worked. However the past few years he's had a more intense attack suspicious with heavier foods. He will get discomfort in his RIGHT upper quadrant. Some bloating. Some nausea. Never to the point of emesis. He does have mild heartburn but problems controls that. This is not like that. Because the attacks are happening more intensely and more easily, he is more open to consider surgery now. Patient has bowel movement once or twice a day. No history of IBS. No Crohn's. No ulcerative colitis. Colonoscopy with mild diverticulosis noted in 2014. Family history of colon polyp/cancer. He can exercise to miles without difficulty. He's never had any abdominal or hernia surgeries. In's. Did have a bladder cancer addressed by cystoscopy.   Other Problems [name], CMA; 01/05/2015 10:10 AM) Bladder Problems Cancer  Past Surgical History [name], CMA; 01/05/2015 10:10 AM) No pertinent past surgical history  Diagnostic Studies History [name], CMA; 01/05/2015 10:10 AM) Colonoscopy within last year  Allergies [name], CMA; 01/05/2015 10:11 AM) Penicillamine *ASSORTED CLASSES* Bee Pollen Plus Ginseng *ALTERNATIVE MEDICINES*  Medication History ([name], CMA; 01/05/2015 10:12 AM) Flexeril (10MG  Tablet, Oral as needed) Active. Zolpidem  Tartrate (10MG  Tablet, Oral as needed) Active. EpiPen 2-Pak (0.3 MG/0.3ML(1:1000) Device, Intramuscular as needed) Active. Medications Reconciled  Social History [name], CMA; 01/05/2015 10:10 AM) Alcohol use Occasional alcohol use. Caffeine use Coffee, Tea. No drug use Tobacco use Former smoker.  Family History [name], CMA; 01/05/2015 10:10 AM) Colon Polyps Father. Heart Disease Father. Respiratory Condition Mother.    Review of Systems [name] CMA; 01/05/2015 10:10 AM) General Not Present- Appetite Loss, Chills, Fatigue, Fever, Night Sweats, Weight Gain and Weight Loss. Skin Not Present- Change in Wart/Mole, Dryness, Hives, Jaundice, New Lesions, Non-Healing Wounds, Rash and Ulcer. HEENT Not Present- Earache, Hearing Loss, Hoarseness, Nose Bleed, Oral Ulcers, Ringing in the Ears, Seasonal Allergies, Sinus Pain, Sore Throat, Visual Disturbances, Wears glasses/contact lenses and Yellow Eyes. Respiratory Not Present- Bloody sputum, Chronic Cough, Difficulty Breathing, Snoring and Wheezing. Breast Not Present- Breast Mass, Breast Pain, Nipple Discharge and Skin Changes. Cardiovascular Not Present- Chest Pain, Difficulty Breathing Lying Down, Leg Cramps, Palpitations, Rapid Heart Rate, Shortness of Breath and Swelling of Extremities. Gastrointestinal Present- Bloating and Gets full quickly at meals. Not Present- Abdominal Pain, Bloody Stool, Change in Bowel Habits, Chronic diarrhea, Constipation, Difficulty Swallowing, Excessive gas, Hemorrhoids, Indigestion, Nausea, Rectal Pain and Vomiting. Male Genitourinary Not Present- Blood in Urine, Change in Urinary Stream, Frequency, Impotence, Nocturia, Painful Urination, Urgency and Urine Leakage. Musculoskeletal Not Present- Back Pain, Joint Pain, Joint Stiffness, Muscle Pain, Muscle Weakness and Swelling of Extremities. Neurological Not Present- Decreased Memory, Fainting, Headaches, Numbness, Seizures, Tingling, Tremor,  Trouble walking and Weakness. Psychiatric Not Present- Anxiety, Bipolar, Change in Sleep Pattern, Depression, Fearful and Frequent crying. Endocrine Not Present- Cold Intolerance, Excessive Hunger, Hair Changes, Heat Intolerance, Hot flashes and New Diabetes. Hematology Not Present- Easy Bruising,  Excessive bleeding, Gland problems, HIV and Persistent Infections.  Vitals ([name] CMA; 01/05/2015 10:11 AM) 01/05/2015 10:10 AM Weight: 212 lb Height: 73in Body Surface Area: 2.23 m Body Mass Index: 27.97 kg/m  Temp.: 97.103F(Temporal)  Pulse: 77 (Regular)  BP: 134/78 (Sitting, Left Arm, Standard)     Physical Exam [name] MD; 01/05/2015 10:58 AM) General Mental Status-Alert. General Appearance-Not in acute distress, Not Sickly. Orientation-Oriented X3. Hydration-Well hydrated. Voice-Normal.  Integumentary Global Assessment Upon inspection and palpation of skin surfaces of the - Axillae: non-tender, no inflammation or ulceration, no drainage. and Distribution of scalp and body hair is normal. General Characteristics Temperature - normal warmth is noted.  Head and Neck Head-normocephalic, atraumatic with no lesions or palpable masses. Face Global Assessment - atraumatic, no absence of expression. Neck Global Assessment - no abnormal movements, no bruit auscultated on the right, no bruit auscultated on the left, no decreased range of motion, non-tender. Trachea-midline. Thyroid Gland Characteristics - non-tender.  Eye Eyeball - Left-Extraocular movements intact, No Nystagmus. Eyeball - Right-Extraocular movements intact, No Nystagmus. Cornea - Left-No Hazy. Cornea - Right-No Hazy. Sclera/Conjunctiva - Left-No scleral icterus, No Discharge. Sclera/Conjunctiva - Right-No scleral icterus, No Discharge. Pupil - Left-Direct reaction to light normal. Pupil - Right-Direct reaction to light normal.  ENMT Ears Pinna - Left - no  drainage observed, no generalized tenderness observed. Right - no drainage observed, no generalized tenderness observed. Nose and Sinuses External Inspection of the Nose - no destructive lesion observed. Inspection of the nares - Left - quiet respiration. Right - quiet respiration. Mouth and Throat Lips - Upper Lip - no fissures observed, no pallor noted. Lower Lip - no fissures observed, no pallor noted. Nasopharynx - no discharge present. Oral Cavity/Oropharynx - Tongue - no dryness observed. Oral Mucosa - no cyanosis observed. Hypopharynx - no evidence of airway distress observed.  Chest and Lung Exam Inspection Movements - Normal and Symmetrical. Accessory muscles - No use of accessory muscles in breathing. Palpation Palpation of the chest reveals - Non-tender. Auscultation Breath sounds - Normal and Clear.  Cardiovascular Auscultation Rhythm - Regular. Murmurs & Other Heart Sounds - Auscultation of the heart reveals - No Murmurs and No Systolic Clicks.  Abdomen Inspection Inspection of the abdomen reveals - No Visible peristalsis and No Abnormal pulsations. Umbilicus - No Bleeding, No Urine drainage. Palpation/Percussion Palpation and Percussion of the abdomen reveal - Soft, Non Tender, No Rebound tenderness, No Rigidity (guarding) and No Cutaneous hyperesthesia. Note: Mild soreness RIGHT upper quadrant. No true Murphy sign. No diastases. Perhaps small 5 mm umbilical hernia through stalk only.   Male Genitourinary Sexual Maturity Tanner 5 - Adult hair pattern and Adult penile size and shape. Note: Circumcised male. Testes epididymides cords normal. No inguinal hernias.   Peripheral Vascular Upper Extremity Inspection - Left - No Cyanotic nailbeds, Not Ischemic. Right - No Cyanotic nailbeds, Not Ischemic.  Neurologic Neurologic evaluation reveals -normal attention span and ability to concentrate, able to name objects and repeat phrases. Appropriate fund of knowledge ,  normal sensation and normal coordination. Mental Status Affect - not angry, not paranoid. Cranial Nerves-Normal Bilaterally. Gait-Normal.  Neuropsychiatric Mental status exam performed with findings of-able to articulate well with normal speech/language, rate, volume and coherence, thought content normal with ability to perform basic computations and apply abstract reasoning and no evidence of hallucinations, delusions, obsessions or homicidal/suicidal ideation.  Musculoskeletal Global Assessment Spine, Ribs and Pelvis - no instability, subluxation or laxity. Right Upper Extremity - no instability, subluxation  or laxity.  Lymphatic Head & Neck  General Head & Neck Lymphatics: Bilateral - Description - No Localized lymphadenopathy. Axillary  General Axillary Region: Bilateral - Description - No Localized lymphadenopathy. Femoral & Inguinal  Generalized Femoral & Inguinal Lymphatics: Left - Description - No Localized lymphadenopathy. Right - Description - No Localized lymphadenopathy.    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  The anatomy & physiology of hepatobiliary & pancreatic function was discussed. The pathophysiology of gallbladder dysfunction was discussed. Natural history risks without surgery was discussed. I feel the risks of no intervention will lead to serious problems that outweigh the operative risks; therefore, I recommended cholecystectomy to remove the pathology. I explained laparoscopic techniques with possible need for an open approach. Probable cholangiogram to evaluate the bilary tract was explained as well.  Risks such as bleeding, infection, abscess, leak, injury to other organs, need for further treatment, heart attack, death, and other risks  were discussed. I noted a good likelihood this will help address the problem. Possibility that this will not correct all abdominal symptoms was explained. Goals of post-operative recovery were discussed as well. We will work to minimize complications. An educational handout further explaining the pathology and treatment options was given as well. Questions were answered. The patient expresses understanding & wishes to proceed with surgery.  Pt Education - [facility] Laparosopic Post Op HCI (Anel Purohit) Pt Education - [facility] Pain Control (Maeryn Mcgath) Pt Education - [facility] Good Bowel Health (Neveyah Garzon)  [name], M.D., F.A.C.S. Gastrointestinal and Minimally Invasive Surgery [facility] [address], [place], [place] [address] [phone] Main / Paging

## 2015-08-20 NOTE — Discharge Instructions (Signed)
LAPAROSCOPIC SURGERY: POST OP INSTRUCTIONS ° °1. DIET: Follow a light bland diet the first 24 hours after arrival home, such as soup, liquids, crackers, etc.  Be sure to include lots of fluids daily.  Avoid fast food or heavy meals as your are more likely to get nauseated.  Eat a low fat the next few days after surgery.   °2. Take your usually prescribed home medications unless otherwise directed. °3. PAIN CONTROL: °a. Pain is best controlled by a usual combination of three different methods TOGETHER: °i. Ice/Heat °ii. Over the counter pain medication °iii. Prescription pain medication °b. Most patients will experience some swelling and bruising around the incisions.  Ice packs or heating pads (30-60 minutes up to 6 times a day) will help. Use ice for the first few days to help decrease swelling and bruising, then switch to heat to help relax tight/sore spots and speed recovery.  Some people prefer to use ice alone, heat alone, alternating between ice & heat.  Experiment to what works for you.  Swelling and bruising can take several weeks to resolve.   °c. It is helpful to take an over-the-counter pain medication regularly for the first few weeks.  Choose one of the following that works best for you: °i. Naproxen (Aleve, etc)  Two 220mg tabs twice a day °ii. Ibuprofen (Advil, etc) Three 200mg tabs four times a day (every meal & bedtime) °iii. Acetaminophen (Tylenol, etc) 500-650mg four times a day (every meal & bedtime) °d. A  prescription for pain medication (such as oxycodone, hydrocodone, etc) should be given to you upon discharge.  Take your pain medication as prescribed.  °i. If you are having problems/concerns with the prescription medicine (does not control pain, nausea, vomiting, rash, itching, etc), please call us [phone] to see if we need to switch you to a different pain medicine that will work better for you and/or control your side effect better. °ii. If you need a refill on your pain medication,  please contact your pharmacy.  They will contact our office to request authorization. Prescriptions will not be filled after 5 pm or on week-ends. °4. Avoid getting constipated.  Between the surgery and the pain medications, it is common to experience some constipation.  Increasing fluid intake and taking a fiber supplement (such as Metamucil, Citrucel, FiberCon, MiraLax, etc) 1-2 times a day regularly will usually help prevent this problem from occurring.  A mild laxative (prune juice, Milk of Magnesia, MiraLax, etc) should be taken according to package directions if there are no bowel movements after 48 hours.   °5. Watch out for diarrhea.  If you have many loose bowel movements, simplify your diet to bland foods & liquids for a few days.  Stop any stool softeners and decrease your fiber supplement.  Switching to mild anti-diarrheal medications (Kayopectate, Pepto Bismol) can help.  If this worsens or does not improve, please call us. °6. Wash / shower every day.  You may shower over the dressings as they are waterproof.  Continue to shower over incision(s) after the dressing is off. °7. Remove your waterproof bandages 5 days after surgery.  You may leave the incision open to air.  You may replace a dressing/Band-Aid to cover the incision for comfort if you wish.  °8. ACTIVITIES as tolerated:   °a. You may resume regular (light) daily activities beginning the next day--such as daily self-care, walking, climbing stairs--gradually increasing activities as tolerated.  If you can walk 30 minutes without difficulty, it   is safe to try more intense activity such as jogging, treadmill, bicycling, low-impact aerobics, swimming, etc. b. Save the most intensive and strenuous activity for last such as sit-ups, heavy lifting, contact sports, etc  Refrain from any heavy lifting or straining until you are off narcotics for pain control.   c. DO NOT PUSH THROUGH PAIN.  Let pain be your guide: If it hurts to do something, don't  do it.  Pain is your body warning you to avoid that activity for another week until the pain goes down. d. You may drive when you are no longer taking prescription pain medication, you can comfortably wear a seatbelt, and you can safely maneuver your car and apply brakes. e. Dennis Bast may have sexual intercourse when it is comfortable.  9. FOLLOW UP in our office a. Please call [facility] at [phone] to set up an appointment to see your surgeon in the office for a follow-up appointment approximately 2-3 weeks after your surgery. b. Make sure that you call for this appointment the day you arrive home to insure a convenient appointment time. 10. IF YOU HAVE DISABILITY OR FAMILY LEAVE FORMS, BRING THEM TO THE OFFICE FOR PROCESSING.  DO NOT GIVE THEM TO YOUR DOCTOR.   WHEN TO CALL us [phone]: 1. Poor pain control 2. Reactions / problems with new medications (rash/itching, nausea, etc)  3. Fever over 101.5 F (38.5 C) 4. Inability to urinate 5. Nausea and/or vomiting 6. Worsening swelling or bruising 7. Continued bleeding from incision. 8. Increased pain, redness, or drainage from the incision   The clinic staff is available to answer your questions during regular business hours (8:30am-5pm).  Please dont hesitate to call and ask to speak to one of our nurses for clinical concerns.   If you have a medical emergency, go to the nearest emergency room or call 911.  A surgeon from [facility] is always on call at the [facility], [place], [place], [place], [place]  [id number] ? MAIN: [phone] ? TOLL FREE: [phone] ?  FAX [phone] [email or web address]  Cholecystitis Cholecystitis is inflammation of the gallbladder. It is often called a gallbladder attack. The gallbladder is a pear-shaped organ that lies beneath the liver on the right side of the body. The gallbladder stores bile, which is a fluid that helps the body  to digest fats. If bile builds up in your gallbladder, your gallbladder becomes inflamed. This condition may occur suddenly (be acute). Repeat episodes of acute cholecystitis or prolonged episodes may lead to a long-term (chronic) condition. Cholecystitis is serious and it requires treatment.  CAUSES The most common cause of this condition is gallstones. Gallstones can block the tube (duct) that carries bile out of your gallbladder. This causes bile to build up. Other causes of this condition include:  Damage to the gallbladder due to a decrease in blood flow.  Infections in the bile ducts.  Scars or kinks in the bile ducts.  Tumors in the liver, pancreas, or gallbladder. RISK FACTORS This condition is more likely to develop in:  People who have sickle cell disease.  People who take birth control pills or use estrogen.  People who have alcoholic liver disease.  People who have liver cirrhosis.  People who have their nutrition delivered through a vein (parenteral nutrition).  People who do not eat or drink (do fasting) for a long period of time.  People who are obese.  People who have  rapid weight loss.  People who are pregnant.  People who have increased triglyceride levels.  People who have pancreatitis. SYMPTOMS Symptoms of this condition include:  Abdominal pain, especially in the upper right area of the abdomen.  Abdominal tenderness or bloating.  Nausea.  Vomiting.  Fever.  Chills.  Yellowing of the skin and the whites of the eyes (jaundice). DIAGNOSIS This condition is diagnosed with a medical history and physical exam. You may also have other tests, including:  Imaging tests, such as:  An ultrasound of the gallbladder.  A CT scan of the abdomen.  A gallbladder nuclear scan (HIDA scan). This scan allows your health care provider to see the bile moving from your liver to your gallbladder and to your small intestine.  MRI.  Blood tests, such  as:  A complete blood count, because the white blood cell count may be higher than normal.  Liver function tests, because some levels may be higher than normal with certain types of gallstones. TREATMENT Treatment may include:  Fasting for a certain amount of time.  IV fluids.  Medicine to treat pain or vomiting.  Antibiotic medicine.  Surgery to remove your gallbladder (cholecystectomy). This may happen immediately or at a later time. [place] care will depend on your treatment. In general:  Take over-the-counter and prescription medicines only as told by your health care provider.  If you were prescribed an antibiotic medicine, take it as told by your health care provider. Do not stop taking the antibiotic even if you start to feel better.  Follow instructions from your health care provider about what to eat or drink. When you are allowed to eat, avoid eating or drinking anything that triggers your symptoms.  Keep all follow-up visits as told by your health care provider. This is important. SEEK MEDICAL CARE IF:  Your pain is not controlled with medicine.  You have a fever. SEEK IMMEDIATE MEDICAL CARE IF:  Your pain moves to another part of your abdomen or to your back.  You continue to have symptoms or you develop new symptoms even with treatment.   This information is not intended to replace advice given to you by your health care provider. Make sure you discuss any questions you have with your health care provider.   Document Released: 10/10/2005 Document Revised: 07/01/2015 Document Reviewed: 01/21/2015 Elsevier Interactive Patient Education 2016 [employer].  Managing Pain  Pain after surgery or related to activity is often due to strain/injury to muscle, tendon, nerves and/or incisions.  This pain is usually short-term and will improve in a few months.   Many people find it helpful to do the following things TOGETHER to help speed the  process of healing and to get back to regular activity more quickly:  1. Avoid heavy physical activity at first a. No lifting greater than 20 pounds at first, then increase to lifting as tolerated over the next few weeks b. Do not push through the pain.  Listen to your body and avoid positions and maneuvers than reproduce the pain.  Wait a few days before trying something more intense c. Walking is okay as tolerated, but go slowly and stop when getting sore.  If you can walk 30 minutes without stopping or pain, you can try more intense activity (running, jogging, aerobics, cycling, swimming, treadmill, sex, sports, weightlifting, etc ) d. Remember: If it hurts to do it, then dont do it!  2. Take Anti-inflammatory medication a. Choose ONE of the following  over-the-counter medications: i.            Acetaminophen 500mg  tabs (Tylenol) 1-2 pills with every meal and just before bedtime (avoid if you have liver problems) ii.            Naproxen 220mg  tabs (ex. Aleve) 1-2 pills twice a day (avoid if you have kidney, stomach, IBD, or bleeding problems) iii. Ibuprofen 200mg  tabs (ex. Advil, Motrin) 3-4 pills with every meal and just before bedtime (avoid if you have kidney, stomach, IBD, or bleeding problems) b. Take with food/snack around the clock for 1-2 weeks i. This helps the muscle and nerve tissues become less irritable and calm down faster  3. Use a Heating pad or Ice/Cold Pack a. 4-6 times a day b. May use warm bath/hottub  or showers  4. Try Gentle Massage and/or Stretching  a. at the area of pain many times a day b. stop if you feel pain - do not overdo it  Try these steps together to help you body heal faster and avoid making things get worse.  Doing just one of these things may not be enough.    If you are not getting better after two weeks or are noticing you are getting worse, contact our office for further advice; we may need to re-evaluate you & see what other things we can do to  help.  GETTING TO GOOD BOWEL HEALTH. Irregular bowel habits such as constipation and diarrhea can lead to many problems over time.  Having one soft bowel movement a day is the most important way to prevent further problems.  The anorectal canal is designed to handle stretching and feces to safely manage our ability to get rid of solid waste (feces, poop, stool) out of our body.  BUT, hard constipated stools can act like ripping concrete bricks and diarrhea can be a burning fire to this very sensitive area of our body, causing inflamed hemorrhoids, anal fissures, increasing risk is perirectal abscesses, abdominal pain/bloating, an making irritable bowel worse.      The goal: ONE SOFT BOWEL MOVEMENT A DAY!  To have soft, regular bowel movements:   Drink plenty of fluids, consider 4-6 tall glasses of water a day.    Take plenty of fiber.  Fiber is the undigested part of plant food that passes into the colon, acting s natures broom to encourage bowel motility and movement.  Fiber can absorb and hold large amounts of water. This results in a larger, bulkier stool, which is soft and easier to pass. Work gradually over several weeks up to 6 servings a day of fiber (25g a day even more if needed) in the form of: o Vegetables -- Root (potatoes, carrots, turnips), leafy green (lettuce, salad greens, celery, spinach), or cooked high residue (cabbage, broccoli, etc) o Fruit -- Fresh (unpeeled skin & pulp), Dried (prunes, apricots, cherries, etc ),  or stewed ( applesauce)  o Whole grain breads, pasta, etc (whole wheat)  o Bran cereals   Bulking Agents -- This type of water-retaining fiber generally is easily obtained each day by one of the following:  o Psyllium bran -- The psyllium plant is remarkable because its ground seeds can retain so much water. This product is available as Metamucil, Konsyl, Effersyllium, Per Diem Fiber, or the less expensive generic preparation in drug and health food stores. Although  labeled a laxative, it really is not a laxative.  o Methylcellulose -- This is another fiber derived from wood which also  retains water. It is available as Citrucel. o Polyethylene Glycol - and artificial fiber commonly called Miralax or Glycolax.  It is helpful for people with gassy or bloated feelings with regular fiber o Flax Seed - a less gassy fiber than psyllium  No reading or other relaxing activity while on the toilet. If bowel movements take longer than 5 minutes, you are too constipated  AVOID CONSTIPATION.  High fiber and water intake usually takes care of this.  Sometimes a laxative is needed to stimulate more frequent bowel movements, but   Laxatives are not a good long-term solution as it can wear the colon out.  They can help jump-start bowels if constipated, but should be relied on constantly without discussing with your doctor o Osmotics (Milk of Magnesia, Fleets phosphosoda, Magnesium citrate, MiraLax, GoLytely) are safer than  o Stimulants (Senokot, Castor Oil, Dulcolax, Ex Lax)    o Avoid taking laxatives for more than 7 days in a row.   IF SEVERELY CONSTIPATED, try a Bowel Retraining Program: o Do not use laxatives.  o Eat a diet high in roughage, such as bran cereals and leafy vegetables.  o Drink six (6) ounces of prune or apricot juice each morning.  o Eat two (2) large servings of stewed fruit each day.  o Take one (1) heaping tablespoon of a psyllium-based bulking agent twice a day. Use sugar-free sweetener when possible to avoid excessive calories.  o Eat a normal breakfast.  o Set aside 15 minutes after breakfast to sit on the toilet, but do not strain to have a bowel movement.  o If you do not have a bowel movement by the third day, use an enema and repeat the above steps.   Controlling diarrhea o Switch to liquids and simpler foods for a few days to avoid stressing your intestines further. o Avoid dairy products (especially milk & ice cream) for a short time.   The intestines often can lose the ability to digest lactose when stressed. o Avoid foods that cause gassiness or bloating.  Typical foods include beans and other legumes, cabbage, broccoli, and dairy foods.  Every person has some sensitivity to other foods, so listen to our body and avoid those foods that trigger problems for you. o Adding fiber (Citrucel, Metamucil, psyllium, Miralax) gradually can help thicken stools by absorbing excess fluid and retrain the intestines to act more normally.  Slowly increase the dose over a few weeks.  Too much fiber too soon can backfire and cause cramping & bloating. o Probiotics (such as active yogurt, Align, etc) may help repopulate the intestines and colon with normal bacteria and calm down a sensitive digestive tract.  Most studies show it to be of mild help, though, and such products can be costly. o Medicines: - Bismuth subsalicylate (ex. Kayopectate, Pepto Bismol) every 30 minutes for up to 6 doses can help control diarrhea.  Avoid if pregnant. - Loperamide (Immodium) can slow down diarrhea.  Start with two tablets (4mg  total) first and then try one tablet every 6 hours.  Avoid if you are having fevers or severe pain.  If you are not better or start feeling worse, stop all medicines and call your doctor for advice o Call your doctor if you are getting worse or not better.  Sometimes further testing (cultures, endoscopy, X-ray studies, bloodwork, etc) may be needed to help diagnose and treat the cause of the diarrhea.  TROUBLESHOOTING IRREGULAR BOWELS 1) Avoid extremes of bowel movements (no bad constipation/diarrhea)  2) Miralax 17gm mixed in [place]. water or juice-daily. May use BID as needed.  3) Gas-x,Phazyme, etc. as needed for gas & bloating.  4) Soft,bland diet. No spicy,greasy,fried foods.  5) Prilosec over-the-counter as needed  6) May hold gluten/wheat products from diet to see if symptoms improve.  7)  May try probiotics (Align, Activa, etc) to help  calm the bowels down 7) If symptoms become worse call back immediately.

## 2015-08-21 ENCOUNTER — Encounter (HOSPITAL_COMMUNITY): Payer: Self-pay | Admitting: Surgery

## 2015-09-11 ENCOUNTER — Other Ambulatory Visit: Payer: Commercial Managed Care - HMO

## 2015-09-14 ENCOUNTER — Other Ambulatory Visit (INDEPENDENT_AMBULATORY_CARE_PROVIDER_SITE_OTHER): Payer: Managed Care, Other (non HMO)

## 2015-09-14 ENCOUNTER — Other Ambulatory Visit: Payer: Commercial Managed Care - HMO

## 2015-09-14 DIAGNOSIS — R972 Elevated prostate specific antigen [PSA]: Secondary | ICD-10-CM | POA: Diagnosis not present

## 2015-09-14 LAB — PSA: PSA: 2.44 ng/mL (ref 0.10–4.00)

## 2016-01-01 ENCOUNTER — Other Ambulatory Visit: Payer: Self-pay | Admitting: Internal Medicine

## 2016-01-01 NOTE — Telephone Encounter (Signed)
Called to the pharmacy by e-scribe. 

## 2016-01-01 NOTE — Telephone Encounter (Signed)
Ok x 6 total

## 2016-02-01 ENCOUNTER — Ambulatory Visit: Payer: Commercial Managed Care - HMO | Admitting: Internal Medicine

## 2016-04-21 ENCOUNTER — Other Ambulatory Visit: Payer: Self-pay | Admitting: Internal Medicine

## 2016-04-21 ENCOUNTER — Other Ambulatory Visit: Payer: Self-pay | Admitting: Family Medicine

## 2016-04-21 NOTE — Telephone Encounter (Signed)
Received a fax from [facility]. Patient requesting an early refill. Traveling 04/25/16 - 05/02/16. Due for fill on 04/29/16. Please advise. Thanks!!

## 2016-04-22 NOTE — Telephone Encounter (Signed)
He has refills  Already written   Ok to fillearly this time  Only see phone note?

## 2016-04-22 NOTE — Telephone Encounter (Signed)
I think i already oked this

## 2016-04-22 NOTE — Telephone Encounter (Signed)
Early Request Called in the pharmacy.

## 2016-06-24 ENCOUNTER — Other Ambulatory Visit: Payer: Self-pay | Admitting: Internal Medicine

## 2016-06-27 NOTE — Telephone Encounter (Signed)
Ok to refill x 1   Has cpx  This month

## 2016-06-28 NOTE — Telephone Encounter (Signed)
Called to the pharmacy and left on machine. 

## 2016-07-01 ENCOUNTER — Other Ambulatory Visit: Payer: Managed Care, Other (non HMO)

## 2016-07-01 ENCOUNTER — Other Ambulatory Visit: Payer: Commercial Managed Care - HMO

## 2016-07-01 ENCOUNTER — Other Ambulatory Visit (INDEPENDENT_AMBULATORY_CARE_PROVIDER_SITE_OTHER): Payer: Managed Care, Other (non HMO)

## 2016-07-01 DIAGNOSIS — Z0000 Encounter for general adult medical examination without abnormal findings: Secondary | ICD-10-CM | POA: Diagnosis not present

## 2016-07-01 LAB — HEPATIC FUNCTION PANEL
ALBUMIN: 4.4 g/dL (ref 3.5–5.2)
ALK PHOS: 72 U/L (ref 39–117)
ALT: 34 U/L (ref 0–53)
AST: 23 U/L (ref 0–37)
Bilirubin, Direct: 0.1 mg/dL (ref 0.0–0.3)
TOTAL PROTEIN: 7.1 g/dL (ref 6.0–8.3)
Total Bilirubin: 0.4 mg/dL (ref 0.2–1.2)

## 2016-07-01 LAB — LIPID PANEL
CHOLESTEROL: 140 mg/dL (ref 0–200)
HDL: 32.4 mg/dL — AB (ref >39.00)
LDL Cholesterol: 74 mg/dL (ref 0–99)
NONHDL: 107.2
Total CHOL/HDL Ratio: 4
Triglycerides: 168 mg/dL — ABNORMAL HIGH (ref 0.0–149.0)
VLDL: 33.6 mg/dL (ref 0.0–40.0)

## 2016-07-01 LAB — CBC WITH DIFFERENTIAL/PLATELET
BASOS ABS: 0 10*3/uL (ref 0.0–0.1)
BASOS PCT: 0.3 % (ref 0.0–3.0)
EOS PCT: 4.1 % (ref 0.0–5.0)
Eosinophils Absolute: 0.4 10*3/uL (ref 0.0–0.7)
HEMATOCRIT: 43.1 % (ref 39.0–52.0)
Hemoglobin: 15 g/dL (ref 13.0–17.0)
LYMPHS ABS: 2.8 10*3/uL (ref 0.7–4.0)
LYMPHS PCT: 31.6 % (ref 12.0–46.0)
MCHC: 34.8 g/dL (ref 30.0–36.0)
MCV: 90.9 fl (ref 78.0–100.0)
MONOS PCT: 7 % (ref 3.0–12.0)
Monocytes Absolute: 0.6 10*3/uL (ref 0.1–1.0)
NEUTROS ABS: 5 10*3/uL (ref 1.4–7.7)
NEUTROS PCT: 57 % (ref 43.0–77.0)
PLATELETS: 244 10*3/uL (ref 150.0–400.0)
RBC: 4.74 Mil/uL (ref 4.22–5.81)
RDW: 12.9 % (ref 11.5–15.5)
WBC: 8.8 10*3/uL (ref 4.0–10.5)

## 2016-07-01 LAB — BASIC METABOLIC PANEL
BUN: 20 mg/dL (ref 6–23)
CALCIUM: 9.2 mg/dL (ref 8.4–10.5)
CHLORIDE: 106 meq/L (ref 96–112)
CO2: 24 meq/L (ref 19–32)
Creatinine, Ser: 0.78 mg/dL (ref 0.40–1.50)
GFR: 109.46 mL/min (ref >60.00)
GLUCOSE: 99 mg/dL (ref 70–99)
POTASSIUM: 3.6 meq/L (ref 3.5–5.1)
SODIUM: 138 meq/L (ref 135–145)

## 2016-07-01 LAB — TSH: TSH: 3.8 u[IU]/mL (ref 0.35–4.50)

## 2016-07-01 LAB — PSA: PSA: 1.92 ng/mL (ref 0.10–4.00)

## 2016-07-07 NOTE — Progress Notes (Signed)
Pre visit review using our clinic review tool, if applicable. No additional management support is needed unless otherwise documented below in the visit note.  Chief Complaint  Patient presents with  . Annual Exam    HPI: Patient  [name]  55 y.o. comes in today for Preventive Health Care visit    No new gu sx  Sees dr Tresa Moore  For fu uro cancer  Stable   Having a problem with his left shoulder. About 2-3 weeks ago he was reaching over to the clock left arm and began to have onset of left shoulder pain. Since then it is some better but has pain with elevation rotation when he plays tennis hard to throw the ball up in the air. No previous injury.  Other question about neck and some itching area on his left forearm he uses cold pack at night and some hydrocortisone with help.  Has a bump on his index finger right hand not painful. Health Maintenance  Topic Date Due  . INFLUENZA VACCINE  03/23/2017 (Originally 05/24/2016)  . Hepatitis C Screening  07/08/2017 (Originally [birth year])  . HIV Screening  07/08/2017 (Originally 07/26/1976)  . COLONOSCOPY  08/22/2018  . 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 GI /GU: No adominal pain, vomiting, change in bowel habits. No blood in the stool. No significant GU symptoms. SKIN/HEME: ,no acute skin rashes suspicious lesions or bleeding. No lymphadenopathy, nodules, masses.  NEURO/ PSYCH:  No  neurologic signs such as weakness numbness. No depression anxiety. IMM/ Allergy: No unusual infections.  Allergy .   REST of 12 system review negative except as per HPI   Past Medical History:  Diagnosis Date  . Bladder cancer ([place]) 2013   RECURRENT (HX  TURBT  2013)  . Borderline hypertension   . BPH (benign prostatic hyperplasia)   . Cholelithiasis    ASYMPTOMATIC  . Diverticulosis of colon   . History of colon polyps   . Insomnia   . Left inguinal hernia   . Lower urinary tract symptoms (LUTS)   . Red skin    nodule under right under arm x 3 days no drainage   . Wears glasses     Past Surgical History:  Procedure Laterality Date  . COLONOSCOPY  2014  . CYSTOSCOPY W/ RETROGRADES Bilateral 02/19/2014   Procedure: CYSTOSCOPY WITH RETROGRADE PYELOGRAM;  Surgeon: Alexis Frock, MD;  Location: [facility];  Service: Urology;  Laterality: Bilateral;  . CYSTOSCOPY W/ RETROGRADES Bilateral 06/18/2014   Procedure: CYSTOSCOPY WITH RETROGRADE PYELOGRAM;  Surgeon: Alexis Frock, MD;  Location: [facility];  Service: Urology;  Laterality: Bilateral;  . CYSTOSCOPY WITH RETROGRADE PYELOGRAM, URETEROSCOPY AND STENT PLACEMENT  10/03/2012   Procedure: CYSTOSCOPY WITH RETROGRADE PYELOGRAM, URETEROSCOPY AND STENT PLACEMENT;  Surgeon: Alexis Frock, MD;  Location[facility];  Service: Urology;  Laterality:  Left;    . HERNIA REPAIR  as child   right inguinal  . LAPAROSCOPIC CHOLECYSTECTOMY SINGLE SITE WITH INTRAOPERATIVE CHOLANGIOGRAM N/A 08/20/2015   Procedure: LAPAROSCOPIC CHOLECYSTECTOMY SINGLE SITE WITH INTRAOPERATIVE CHOLANGIOGRAM;  Surgeon: Michael Boston, MD;  Location: [facility];  Service: General;  Laterality: N/A;  . TONSILLECTOMY  as child  . TRANSURETHRAL RESECTION OF BLADDER TUMOR  10/03/2012   Procedure: TRANSURETHRAL RESECTION OF BLADDER TUMOR (TURBT);  Surgeon: Alexis Frock, MD;  Location: [facility];  Service: Urology;   Laterality: N/A;  . TRANSURETHRAL RESECTION OF BLADDER TUMOR WITH GYRUS (TURBT-GYRUS) N/A 02/19/2014   Procedure: TRANSURETHRAL RESECTION OF BLADDER TUMOR WITH GYRUS (TURBT-GYRUS);  Surgeon: Alexis Frock, MD;  Location: [facility];  Service: Urology;  Laterality: N/A;  . TRANSURETHRAL RESECTION OF BLADDER TUMOR WITH GYRUS (TURBT-GYRUS) N/A 06/18/2014   Procedure: TRANSURETHRAL RESECTION OF BLADDER TUMOR WITH GYRUS (TURBT-GYRUS) WITH MITOMYCIN INSTILLATION;  Surgeon: Alexis Frock, MD;  Location: [facility];  Service: Urology;  Laterality: N/A;    Family History  Problem Relation Age of Onset  . Lung cancer Mother   . Colon cancer      fathers side   . ADD / ADHD Son     Social History   Social History  . Marital status: Married    Spouse name: N/A  . Number of children: N/A  . Years of education: N/A   Social History Main Topics  . Smoking status: Former Smoker    Packs/day: 1.00    Years: 10.00    Types: Cigarettes    Quit date: 10/24/2001  . Smokeless tobacco: Never Used  . Alcohol use No  . Drug use: No  . Sexual activity: Not Asked   Other Topics Concern  . 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 cyclobenzaprine (FLEXERIL) 10 MG tablet TAKE 1 TABLET THREE TIMES DAILY AS NEEDED FOR MUSCLE SPASMS. 40 tablet 0  . EPINEPHrine (EPIPEN) 0.3 mg/0.3 mL DEVI Inject 0.3 mLs (0.3 mg total) into the muscle once. 1 Device 0  . oxyCODONE (OXY IR/ROXICODONE) 5 MG immediate release tablet Take 1-2 tablets (5-10 mg total) by mouth every 4 (four) hours as needed for moderate pain, severe pain or breakthrough pain. 40 tablet 0  . zolpidem (AMBIEN) 10 MG tablet TAKE 1 TABLET AT BEDTIME AS NEEDED FOR SLEEP. 30 tablet 0   No facility-administered medications prior to visit.       EXAM:  BP 134/80 (BP Location: Right Arm, Patient Position: Sitting, Cuff Size: Large)   Temp 98.1 F (36.7 C) (Oral)   Ht 6\' 1"  (1.854 m)   Wt 203 lb 3.2 oz (92.2 kg)   BMI 26.81 kg/m   Body mass index is 26.81 kg/m.  Physical Exam: Vital signs reviewed [id number] is a well-developed well-nourished alert cooperative    who appearsr stated age in no acute distress.  HEENT: normocephalic atraumatic , Eyes: PERRL EOM's full, conjunctiva clear, Nares: paten,t no deformity discharge or tenderness., Ears: no deformity EAC's clear TMs with normal landmarks. Mouth: clear OP, no lesions, edema.  Moist mucous membranes. Dentition in adequate repair. NECK: supple without masses, thyromegaly or bruits. CHEST/PULM:  Clear to auscultation and percussion breath sounds equal  no wheeze , rales or rhonchi. No chest wall deformities or tenderness. CV: PMI is nondisplaced, S1 S2 no gallops, murmurs, rubs. Peripheral pulses are full without delay.No JVD .  ABDOMEN: Bowel sounds normal nontender  No guard or rebound, no hepato splenomegal no CVA tenderness.  No hernia. Extremtities:  No clubbing cyanosis or edema, no acute joint swelling or redness no focal atrophy some decreased rotation left shoulder because of pain across body elevation above 90 with some discomfort. Right index finger small node at the DIP. Nontender good range of motion. NEURO:  Oriented x3, cranial nerves 3-12 appear to be intact, no obvious focal weakness,gait within normal limits no abnormal reflexes or asymmetrical SKIN: No acute rashes normal turgor, color, no bruising or petechiae. Left forearm some old-pigmentation from scratching. If you excoriations. PSYCH: Oriented, good eye contact, no obvious depression anxiety, cognition and judgment appear normal. LN: no cervical axillary inguinal adenopathy  Lab Results  Component Value Date   WBC 8.8 07/01/2016   HGB 15.0 07/01/2016   HCT 43.1 07/01/2016   PLT 244.0 07/01/2016    GLUCOSE 99 07/01/2016   CHOL 140 07/01/2016   TRIG 168.0 (H) 07/01/2016   HDL 32.40 (L) 07/01/2016   LDLDIRECT 120.3 07/21/2008   LDLCALC 74 07/01/2016   ALT 34 07/01/2016   AST 23 07/01/2016   NA 138 07/01/2016   K 3.6 07/01/2016   CL 106 07/01/2016   CREATININE 0.78 07/01/2016   BUN 20 07/01/2016   CO2 24 07/01/2016   TSH 3.80 07/01/2016   PSA 1.92 07/01/2016   HGBA1C 5.1 11/20/2012   BP Readings from Last 3 Encounters:  07/08/16 134/80  08/20/15 (!) 143/63  08/19/15 (!) 148/90   Wt Readings from Last 3 Encounters:  07/08/16 203 lb 3.2 oz (92.2 kg)  08/20/15 201 lb (91.2 kg)  08/19/15 201 lb (91.2 kg)    ASSESSMENT AND PLAN:  Discussed the following assessment and plan:  Visit for preventive health examination  Insomnia - At this time okay to remain on Ambien benefit more than risk discussed going off again asked if he can do this next year not using alcohol. Seems to function we  Left shoulder pain - Probable RC tendinitis strain based on context options discussed ice exercise consider sports medicine referral he can call or email [place] for that area  Medication management  Low HDL (under 40) - lsi reviewed  Patient Care Team: Burnis Medin, MD as PCP - General Alexis Frock, MD as Attending Physician (Urology) Patient Instructions  attend to continue healthy life style Left shoudler seems like   strain tendinitnis   Or rotator cuff injury .   Ice a fter activity  If  persistent or progressive we can have you see sports  medicine for further evaluation and rehab  Ok to take advil naproxyn for pain also    Healthy lifestyle includes : At least 150 minutes of exercise weeks  , weight at healthy levels, which is usually   BMI 19-25. Avoid trans fats and processed foods;  Increase fresh fruits and veges to 5 servings per day. And avoid sweet beverages including tea and juice. Mediterranean diet with olive oil and nuts have been noted to be heart and brain  healthy . Avoid tobacco products . Limit  alcohol to  7 per week for women and 14 servings for men.  Get adequate sleep . Wear seat belts . Don't text and drive .     Generic Shoulder Exercises  EXERCISES  RANGE OF MOTION (ROM) AND STRETCHING EXERCISES These exercises may help you when beginning to rehabilitate your injury. Your symptoms may resolve with or without further involvement from your physician, physical therapist or athletic trainer. While completing these exercises, remember:   Restoring tissue flexibility helps normal motion to return to the joints. This allows healthier, less painful movement and activity.  An effective stretch should be held for at least 30 seconds.  A stretch should never be painful. You should only feel a gentle lengthening or release in the stretched tissue. ROM - Pendulum  Bend at the waist so that your right / left arm falls away from your body. Support yourself with your opposite hand on a solid surface, such as a table or a countertop.  Your right / left arm should be perpendicular to the ground. If it is not perpendicular, you need to lean over farther. Relax the muscles in your right / left arm and shoulder as much as possible.  Gently sway your hips and trunk so they move your right / left arm without any use of your right / left shoulder muscles.  Progress your movements so that your right / left arm moves side to side, then forward and backward, and finally, both clockwise and counterclockwise.  Complete __________ repetitions in each direction. Many people use this exercise to relieve discomfort in their shoulder as well as to gain range of motion. Repeat __________ times. Complete this exercise __________ times per day. STRETCH - Flexion, Standing  Stand with good posture. With an underhand grip on your right / left hand and an overhand grip on the opposite hand, grasp a broomstick or cane so that your hands are a little more than  shoulder-width apart.  Keeping your right / left elbow straight and shoulder muscles relaxed, push the stick with your opposite hand to raise your right / left arm in front of your body and then overhead. Raise your arm until you feel a stretch in your right / left shoulder, but before you have increased shoulder pain.  Try to avoid shrugging your right / left shoulder as your arm rises by keeping your shoulder blade tucked down and toward your mid-back spine. Hold __________ seconds.  Slowly return to the starting position. Repeat __________ times. Complete this exercise __________ times per day. STRETCH - Internal Rotation  Place your right / left hand behind your back, palm-up.  Throw a towel or belt over your opposite shoulder. Grasp the towel/belt with your right / left hand.  While keeping an upright posture, gently pull up on the towel/belt until you feel a stretch in the front of your right / left shoulder.  Avoid shrugging your right / left shoulder as your arm rises by keeping your shoulder blade tucked down and toward your mid-back spine.  Hold __________. Release the stretch by lowering your opposite hand. Repeat __________ times. Complete this exercise __________ times per day. STRETCH - External Rotation and Abduction  Stagger your stance through a doorframe. It does not matter which foot is forward.  As instructed by your physician, physical therapist or athletic trainer, place your hands:  And forearms above your head and on the door frame.  And forearms at head-height and on the door frame.  At elbow-height and on the door frame.  Keeping your head and chest upright and your stomach muscles tight to prevent over-extending your low-back, slowly shift your weight onto your front foot until you feel a stretch  across your chest and/or in the front of your shoulders.  Hold __________ seconds. Shift your weight to your back foot to release the stretch. Repeat __________  times. Complete this stretch __________ times per day.  STRENGTHENING EXERCISES  These exercises may help you when beginning to rehabilitate your injury. They may resolve your symptoms with or without further involvement from your physician, physical therapist or athletic trainer. While completing these exercises, remember:   Muscles can gain both the endurance and the strength needed for everyday activities through controlled exercises.  Complete these exercises as instructed by your physician, physical therapist or athletic trainer. Progress the resistance and repetitions only as guided.  You may experience muscle soreness or fatigue, but the pain or discomfort you are trying to eliminate should never worsen during these exercises. If this pain does worsen, stop and make certain you are following the directions exactly. If the pain is still present after adjustments, discontinue the exercise until you can discuss the trouble with your clinician.  If advised by your physician, during your recovery, avoid activity or exercises which involve actions that place your right / left hand or elbow above your head or behind your back or head. These positions stress the tissues which are trying to heal. STRENGTH - Scapular Depression and Adduction  With good posture, sit on a firm chair. Supported your arms in front of you with pillows, arm rests or a table top. Have your elbows in line with the sides of your body.  Gently draw your shoulder blades down and toward your mid-back spine. Gradually increase the tension without tensing the muscles along the top of your shoulders and the back of your neck.  Hold for __________ seconds. Slowly release the tension and relax your muscles completely before completing the next repetition.  After you have practiced this exercise, remove the arm support and complete it in standing as well as sitting. Repeat __________ times. Complete this exercise __________ times per  day.  STRENGTH - External Rotators  Secure a rubber exercise band/tubing to a fixed object so that it is at the same height as your right / left elbow when you are standing or sitting on a firm surface.  Stand or sit so that the secured exercise band/tubing is at your side that is not injured.  Bend your elbow 90 degrees. Place a folded towel or small pillow under your right / left arm so that your elbow is a few inches away from your side.  Keeping the tension on the exercise band/tubing, pull it away from your body, as if pivoting on your elbow. Be sure to keep your body steady so that the movement is only coming from your shoulder rotating.  Hold __________ seconds. Release the tension in a controlled manner as you return to the starting position. Repeat __________ times. Complete this exercise __________ times per day.  STRENGTH - Supraspinatus  Stand or sit with good posture. Grasp a __________ weight or an exercise band/tubing so that your hand is "thumbs-up," like when you shake hands.  Slowly lift your right / left hand from your thigh into the air, traveling about 30 degrees from straight out at your side. Lift your hand to shoulder height or as far as you can without increasing any shoulder pain. Initially, many people do not lift their hands above shoulder height.  Avoid shrugging your right / left shoulder as your arm rises by keeping your shoulder blade tucked down and toward your mid-back spine.  Hold for __________ seconds. Control the descent of your hand as you slowly return to your starting position. Repeat __________ times. Complete this exercise __________ times per day.  STRENGTH - Shoulder Extensors  Secure a rubber exercise band/tubing so that it is at the height of your shoulders when you are either standing or sitting on a firm arm-less chair.  With a thumbs-up grip, grasp an end of the band/tubing in each hand. Straighten your elbows and lift your hands straight  in front of you at shoulder height. Step back away from the secured end of band/tubing until it becomes tense.  Squeezing your shoulder blades together, pull your hands down to the sides of your thighs. Do not allow your hands to go behind you.  Hold for __________ seconds. Slowly ease the tension on the band/tubing as you reverse the directions and return to the starting position. Repeat __________ times. Complete this exercise __________ times per day.  STRENGTH - Scapular Retractors  Secure a rubber exercise band/tubing so that it is at the height of your shoulders when you are either standing or sitting on a firm arm-less chair.  With a palm-down grip, grasp an end of the band/tubing in each hand. Straighten your elbows and lift your hands straight in front of you at shoulder height. Step back away from the secured end of band/tubing until it becomes tense.  Squeezing your shoulder blades together, draw your elbows back as you bend them. Keep your upper arm lifted away from your body throughout the exercise.  Hold __________ seconds. Slowly ease the tension on the band/tubing as you reverse the directions and return to the starting position. Repeat __________ times. Complete this exercise __________ times per day. STRENGTH - Scapular Depressors  Find a sturdy chair without wheels, such as a from a dining room table.  Keeping your feet on the floor, lift your bottom from the seat and lock your elbows.  Keeping your elbows straight, allow gravity to pull your body weight down. Your shoulders will rise toward your ears.  Raise your body against gravity by drawing your shoulder blades down your back, shortening the distance between your shoulders and ears. Although your feet should always maintain contact with the floor, your feet should progressively support less body weight as you get stronger.  Hold __________ seconds. In a controlled and slow manner, lower your body weight to begin the  next repetition. Repeat __________ times. Complete this exercise __________ times per day.    This information is not intended to replace advice given to you by your health care provider. Make sure you discuss any questions you have with your health care provider.   Document Released: 08/24/2005 Document Revised: 10/31/2014 Document Reviewed: 01/22/2009 Elsevier Interactive Patient Education 2016 [place]. Danessa Mensch M.D.

## 2016-07-08 ENCOUNTER — Encounter: Payer: Self-pay | Admitting: Internal Medicine

## 2016-07-08 ENCOUNTER — Ambulatory Visit (INDEPENDENT_AMBULATORY_CARE_PROVIDER_SITE_OTHER): Payer: Managed Care, Other (non HMO) | Admitting: Internal Medicine

## 2016-07-08 VITALS — BP 134/80 | Temp 98.1°F | Ht 73.0 in | Wt 203.2 lb

## 2016-07-08 DIAGNOSIS — E786 Lipoprotein deficiency: Secondary | ICD-10-CM

## 2016-07-08 DIAGNOSIS — Z79899 Other long term (current) drug therapy: Secondary | ICD-10-CM | POA: Diagnosis not present

## 2016-07-08 DIAGNOSIS — M25512 Pain in left shoulder: Secondary | ICD-10-CM | POA: Diagnosis not present

## 2016-07-08 DIAGNOSIS — Z0000 Encounter for general adult medical examination without abnormal findings: Secondary | ICD-10-CM | POA: Diagnosis not present

## 2016-07-08 DIAGNOSIS — G4700 Insomnia, unspecified: Secondary | ICD-10-CM

## 2016-07-08 MED ORDER — ZOLPIDEM TARTRATE 10 MG PO TABS: 10.0000 mg | ORAL_TABLET | Freq: Every evening | ORAL | 4 refills | Status: DC | PRN

## 2016-07-08 NOTE — Patient Instructions (Addendum)
attend to continue healthy life style Left shoudler seems like   strain tendinitnis   Or rotator cuff injury .   Ice a fter activity  If  persistent or progressive we can have you see sports  medicine for further evaluation and rehab  Ok to take advil naproxyn for pain also    Healthy lifestyle includes : At least 150 minutes of exercise weeks  , weight at healthy levels, which is usually   BMI 19-25. Avoid trans fats and processed foods;  Increase fresh fruits and veges to 5 servings per day. And avoid sweet beverages including tea and juice. Mediterranean diet with olive oil and nuts have been noted to be heart and brain healthy . Avoid tobacco products . Limit  alcohol to  7 per week for women and 14 servings for men.  Get adequate sleep . Wear seat belts . Don't text and drive .     Generic Shoulder Exercises EXERCISES  RANGE OF MOTION (ROM) AND STRETCHING EXERCISES These exercises may help you when beginning to rehabilitate your injury. Your symptoms may resolve with or without further involvement from your physician, physical therapist or athletic trainer. While completing these exercises, remember:   Restoring tissue flexibility helps normal motion to return to the joints. This allows healthier, less painful movement and activity.  An effective stretch should be held for at least 30 seconds.  A stretch should never be painful. You should only feel a gentle lengthening or release in the stretched tissue. ROM - Pendulum  Bend at the waist so that your right / left arm falls away from your body. Support yourself with your opposite hand on a solid surface, such as a table or a countertop.  Your right / left arm should be perpendicular to the ground. If it is not perpendicular, you need to lean over farther. Relax the muscles in your right / left arm and shoulder as much as possible.  Gently sway your hips and trunk so they move your right / left arm without any use of your right  / left shoulder muscles.  Progress your movements so that your right / left arm moves side to side, then forward and backward, and finally, both clockwise and counterclockwise.  Complete __________ repetitions in each direction. Many people use this exercise to relieve discomfort in their shoulder as well as to gain range of motion. Repeat __________ times. Complete this exercise __________ times per day. STRETCH - Flexion, Standing  Stand with good posture. With an underhand grip on your right / left hand and an overhand grip on the opposite hand, grasp a broomstick or cane so that your hands are a little more than shoulder-width apart.  Keeping your right / left elbow straight and shoulder muscles relaxed, push the stick with your opposite hand to raise your right / left arm in front of your body and then overhead. Raise your arm until you feel a stretch in your right / left shoulder, but before you have increased shoulder pain.  Try to avoid shrugging your right / left shoulder as your arm rises by keeping your shoulder blade tucked down and toward your mid-back spine. Hold __________ seconds.  Slowly return to the starting position. Repeat __________ times. Complete this exercise __________ times per day. STRETCH - Internal Rotation  Place your right / left hand behind your back, palm-up.  Throw a towel or belt over your opposite shoulder. Grasp the towel/belt with your right / left hand.  While keeping an upright posture, gently pull up on the towel/belt until you feel a stretch in the front of your right / left shoulder.  Avoid shrugging your right / left shoulder as your arm rises by keeping your shoulder blade tucked down and toward your mid-back spine.  Hold __________. Release the stretch by lowering your opposite hand. Repeat __________ times. Complete this exercise __________ times per day. STRETCH - External Rotation and Abduction  Stagger your stance through a doorframe.  It does not matter which foot is forward.  As instructed by your physician, physical therapist or athletic trainer, place your hands:  And forearms above your head and on the door frame.  And forearms at head-height and on the door frame.  At elbow-height and on the door frame.  Keeping your head and chest upright and your stomach muscles tight to prevent over-extending your low-back, slowly shift your weight onto your front foot until you feel a stretch across your chest and/or in the front of your shoulders.  Hold __________ seconds. Shift your weight to your back foot to release the stretch. Repeat __________ times. Complete this stretch __________ times per day.  STRENGTHENING EXERCISES  These exercises may help you when beginning to rehabilitate your injury. They may resolve your symptoms with or without further involvement from your physician, physical therapist or athletic trainer. While completing these exercises, remember:   Muscles can gain both the endurance and the strength needed for everyday activities through controlled exercises.  Complete these exercises as instructed by your physician, physical therapist or athletic trainer. Progress the resistance and repetitions only as guided.  You may experience muscle soreness or fatigue, but the pain or discomfort you are trying to eliminate should never worsen during these exercises. If this pain does worsen, stop and make certain you are following the directions exactly. If the pain is still present after adjustments, discontinue the exercise until you can discuss the trouble with your clinician.  If advised by your physician, during your recovery, avoid activity or exercises which involve actions that place your right / left hand or elbow above your head or behind your back or head. These positions stress the tissues which are trying to heal. STRENGTH - Scapular Depression and Adduction  With good posture, sit on a firm chair.  Supported your arms in front of you with pillows, arm rests or a table top. Have your elbows in line with the sides of your body.  Gently draw your shoulder blades down and toward your mid-back spine. Gradually increase the tension without tensing the muscles along the top of your shoulders and the back of your neck.  Hold for __________ seconds. Slowly release the tension and relax your muscles completely before completing the next repetition.  After you have practiced this exercise, remove the arm support and complete it in standing as well as sitting. Repeat __________ times. Complete this exercise __________ times per day.  STRENGTH - External Rotators  Secure a rubber exercise band/tubing to a fixed object so that it is at the same height as your right / left elbow when you are standing or sitting on a firm surface.  Stand or sit so that the secured exercise band/tubing is at your side that is not injured.  Bend your elbow 90 degrees. Place a folded towel or small pillow under your right / left arm so that your elbow is a few inches away from your side.  Keeping the tension on the exercise band/tubing,  pull it away from your body, as if pivoting on your elbow. Be sure to keep your body steady so that the movement is only coming from your shoulder rotating.  Hold __________ seconds. Release the tension in a controlled manner as you return to the starting position. Repeat __________ times. Complete this exercise __________ times per day.  STRENGTH - Supraspinatus  Stand or sit with good posture. Grasp a __________ weight or an exercise band/tubing so that your hand is "thumbs-up," like when you shake hands.  Slowly lift your right / left hand from your thigh into the air, traveling about 30 degrees from straight out at your side. Lift your hand to shoulder height or as far as you can without increasing any shoulder pain. Initially, many people do not lift their hands above shoulder  height.  Avoid shrugging your right / left shoulder as your arm rises by keeping your shoulder blade tucked down and toward your mid-back spine.  Hold for __________ seconds. Control the descent of your hand as you slowly return to your starting position. Repeat __________ times. Complete this exercise __________ times per day.  STRENGTH - Shoulder Extensors  Secure a rubber exercise band/tubing so that it is at the height of your shoulders when you are either standing or sitting on a firm arm-less chair.  With a thumbs-up grip, grasp an end of the band/tubing in each hand. Straighten your elbows and lift your hands straight in front of you at shoulder height. Step back away from the secured end of band/tubing until it becomes tense.  Squeezing your shoulder blades together, pull your hands down to the sides of your thighs. Do not allow your hands to go behind you.  Hold for __________ seconds. Slowly ease the tension on the band/tubing as you reverse the directions and return to the starting position. Repeat __________ times. Complete this exercise __________ times per day.  STRENGTH - Scapular Retractors  Secure a rubber exercise band/tubing so that it is at the height of your shoulders when you are either standing or sitting on a firm arm-less chair.  With a palm-down grip, grasp an end of the band/tubing in each hand. Straighten your elbows and lift your hands straight in front of you at shoulder height. Step back away from the secured end of band/tubing until it becomes tense.  Squeezing your shoulder blades together, draw your elbows back as you bend them. Keep your upper arm lifted away from your body throughout the exercise.  Hold __________ seconds. Slowly ease the tension on the band/tubing as you reverse the directions and return to the starting position. Repeat __________ times. Complete this exercise __________ times per day. STRENGTH - Scapular Depressors  Find a sturdy  chair without wheels, such as a from a dining room table.  Keeping your feet on the floor, lift your bottom from the seat and lock your elbows.  Keeping your elbows straight, allow gravity to pull your body weight down. Your shoulders will rise toward your ears.  Raise your body against gravity by drawing your shoulder blades down your back, shortening the distance between your shoulders and ears. Although your feet should always maintain contact with the floor, your feet should progressively support less body weight as you get stronger.  Hold __________ seconds. In a controlled and slow manner, lower your body weight to begin the next repetition. Repeat __________ times. Complete this exercise __________ times per day.    This information is not intended to replace  advice given to you by your health care provider. Make sure you discuss any questions you have with your health care provider.   Document Released: 08/24/2005 Document Revised: 10/31/2014 Document Reviewed: 01/22/2009 [employer] Interactive Patient Education [employer].

## 2016-12-15 ENCOUNTER — Other Ambulatory Visit: Payer: Self-pay | Admitting: Internal Medicine

## 2016-12-15 NOTE — Telephone Encounter (Signed)
Ok to refill x 4 

## 2016-12-19 NOTE — Telephone Encounter (Signed)
Called to the pharmacy and left on machine. 

## 2017-04-14 ENCOUNTER — Other Ambulatory Visit: Payer: Self-pay | Admitting: Internal Medicine

## 2017-04-18 ENCOUNTER — Other Ambulatory Visit: Payer: Self-pay | Admitting: Internal Medicine

## 2017-04-18 NOTE — Telephone Encounter (Signed)
Ok to refrill x  3 ( 30 # at a time)

## 2017-07-07 ENCOUNTER — Other Ambulatory Visit: Payer: Managed Care, Other (non HMO)

## 2017-07-13 NOTE — Progress Notes (Signed)
Chief Complaint  Patient presents with  . Annual Exam    no new concerns    HPI: Patient  [name]  56 y.o. comes in today for [facility] visit  And med management   Sleep uses most nights but not always   Due for urology check   Samuel [place] getting better  Dec hearing a bit no pain   Itchy area left arm for weeks and old tick bite on back for 2 months no systemic sx   Health Maintenance  Topic Date Due  . INFLUENZA VACCINE  05/24/2017  . COLONOSCOPY  08/22/2018  . TETANUS/TDAP  08/04/2020  . Hepatitis C Screening  Completed  . 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  GEN/ HEENT: No fever, significant weight changes sweats headaches vision problems hearing changes, CV/ PULM; No chest pain shortness of breath cough, syncope,edema  change in exercise tolerance. GI /GU: No adominal pain, vomiting, change in bowel habits. No blood in the stool. No significant GU symptoms. SKIN/HEME: ,no acute skin rashes suspicious lesions or bleeding. No lymphadenopathy, nodules, masses.  NEURO/ PSYCH:  No neurologic signs such as weakness numbness. No depression anxiety. IMM/ Allergy: No unusual infections.  Allergy .   REST of 12 system review negative except as per HPI   Past Medical History:  Diagnosis Date  . Bladder cancer ([place]) 2013   RECURRENT (HX  TURBT  2013)  . Borderline hypertension   . BPH (benign prostatic hyperplasia)   . Cholelithiasis    ASYMPTOMATIC  . Diverticulosis of colon   . History of colon polyps   . Insomnia   . Left inguinal hernia   . Lower urinary tract symptoms (LUTS)   . Red skin    nodule under right under arm x 3 days  no drainage   . Wears glasses     Past Surgical History:  Procedure Laterality Date  . COLONOSCOPY  2014  . CYSTOSCOPY W/ RETROGRADES Bilateral 02/19/2014   Procedure: CYSTOSCOPY WITH RETROGRADE PYELOGRAM;  Surgeon: Alexis Frock, MD;  Location: [facility];  Service: Urology;  Laterality: Bilateral;  . CYSTOSCOPY W/ RETROGRADES Bilateral 06/18/2014   Procedure: CYSTOSCOPY WITH RETROGRADE PYELOGRAM;  Surgeon: Alexis Frock, MD;  Location: [facility];  Service: Urology;  Laterality: Bilateral;  . CYSTOSCOPY WITH RETROGRADE PYELOGRAM, URETEROSCOPY AND STENT PLACEMENT  10/03/2012   Procedure: CYSTOSCOPY WITH RETROGRADE PYELOGRAM, URETEROSCOPY AND STENT PLACEMENT;  Surgeon: Alexis Frock, MD;  Location: [facility];  Service: Urology;  Laterality: Left;    . HERNIA REPAIR  as child   right inguinal  . LAPAROSCOPIC CHOLECYSTECTOMY SINGLE SITE WITH INTRAOPERATIVE CHOLANGIOGRAM N/A 08/20/2015   Procedure: LAPAROSCOPIC CHOLECYSTECTOMY SINGLE SITE WITH INTRAOPERATIVE CHOLANGIOGRAM;  Surgeon: Michael Boston, MD;  Location: [facility];  Service: General;  Laterality: N/A;  . TONSILLECTOMY  as child  . TRANSURETHRAL RESECTION OF BLADDER TUMOR  10/03/2012   Procedure: TRANSURETHRAL RESECTION OF BLADDER TUMOR (TURBT);  Surgeon: Alexis Frock, MD;  Location: [facility];  Service: Urology;  Laterality: N/A;  . TRANSURETHRAL RESECTION OF BLADDER TUMOR WITH GYRUS (TURBT-GYRUS) N/A  02/19/2014   Procedure: TRANSURETHRAL RESECTION OF BLADDER TUMOR WITH GYRUS (TURBT-GYRUS);  Surgeon: Theodore Manny, MD;  Location: [facility];  Service: Urology;  Laterality: N/A;  . TRANSURETHRAL RESECTION OF BLADDER TUMOR WITH GYRUS (TURBT-GYRUS) N/A 06/18/2014   Procedure: TRANSURETHRAL RESECTION OF BLADDER TUMOR WITH GYRUS (TURBT-GYRUS) WITH MITOMYCIN INSTILLATION;  Surgeon: Theodore Manny, MD;  Location: [facility];  Service: Urology;   Laterality: N/A;    Family History  Problem Relation Age of Onset  . Lung cancer Mother   . Colon cancer Unknown        fathers side   . ADD / ADHD Son     Social History   Social History  . Marital status: Married    Spouse name: N/A  . Number of children: N/A  . Years of education: N/A   Social History Main Topics  . Smoking status: Former Smoker    Packs/day: 1.00    Years: 10.00    Types: Cigarettes    Quit date: 10/24/2001  . Smokeless tobacco: Never Used  . Alcohol use No  . Drug use: No  . Sexual activity: Not Asked   Other Topics Concern  . None   Social History Narrative   hhof 5    Married    See centricity  EHR   No currently smoking.. 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 cyclobenzaprine (FLEXERIL) 10 MG tablet TAKE 1 TABLET THREE TIMES DAILY AS NEEDED FOR MUSCLE SPASMS. 40 tablet 0  . EPINEPHrine (EPIPEN) 0.3 mg/0.3 mL DEVI Inject 0.3 mLs (0.3 mg total) into the muscle once. 1 Device 0  . zolpidem (AMBIEN) 10 MG tablet TAKE 1 TABLET AT BEDTIME AS NEEDED FOR SLEEP. 30 tablet 2   No facility-administered medications prior to visit.      EXAM:  BP 140/86   Pulse 64   Temp 98.1 F (36.7 C) (Oral)   Ht 6' 0.75" (1.848 m)   Wt 196 lb (88.9 kg)   BMI 26.04 kg/m   Body mass index is 26.04 kg/m. Wt Readings from Last 3 Encounters:  07/14/17 196 lb (88.9 kg)  07/08/16 203 lb 3.2 oz (92.2 kg)  08/20/15 201 lb (91.2 kg)    Physical Exam: Vital signs reviewed GEN:This is a well-developed well-nourished alert cooperative    who appearsr stated age in no acute distress.  HEENT: normocephalic atraumatic , Eyes: PERRL glasses  conjunctiva clear, Nares: paten,t no deformity discharge or tenderness., Ears: no deformity EAC's clear TMs with normal landmarks. Mouth: clear OP, no lesions, edema.  Moist mucous membranes. Dentition in adequate repair. Mild congestion no    Face tenderness   tmx intact  NECK: supple without masses, thyromegaly or bruits. CHEST/PULM:  Clear to auscultation and percussion breath sounds equal no wheeze , rales or rhonchi. No chest wall deformities or tenderness. Breast: normal by inspection . No dimpling, discharge, masses, tenderness or discharge . CV: PMI is nondisplaced, S1 S2 no gallops, murmurs, rubs. Peripheral pulses are full without delay.No JVD .  ABDOMEN: Bowel sounds normal nontender  No guard or rebound, no hepato splenomegal no CVA tenderness.  No hernia.well healed scar  Extremtities:  No clubbing cyanosis or edema, no acute joint swelling or redness no focal atrophy NEURO:  Oriented x3, cranial nerves 3-12 appear to be intact, no obvious focal weakness,gait within normal limits no abnormal   reflexes or asymmetrical SKIN:normal turgor, color, no bruising or petechiae. Left arm excoriation above elbow  Back less than 1 cm itchy papule no fb noted  No em  PSYCH: Oriented, good eye contact, no obvious depression anxiety, cognition and judgment appear normal. LN: no cervical axillary  adenopathy  Lab Results  Component Value Date   WBC 8.8 07/01/2016   HGB 15.0 07/01/2016   HCT 43.1 07/01/2016   PLT 244.0 07/01/2016   GLUCOSE 99 07/01/2016   CHOL 140 07/01/2016   TRIG 168.0 (H) 07/01/2016   HDL 32.40 (L) 07/01/2016   LDLDIRECT 120.3 07/21/2008   LDLCALC 74 07/01/2016   ALT 34 07/01/2016   AST 23 07/01/2016   NA 138 07/01/2016   K 3.6 07/01/2016   CL 106 07/01/2016   CREATININE 0.78 07/01/2016   BUN 20 07/01/2016   CO2 24 07/01/2016   TSH 3.80 07/01/2016   PSA 1.92 07/01/2016   HGBA1C 5.1 11/20/2012    BP Readings from Last 3 Encounters:  07/14/17 140/86  07/08/16 134/80  08/20/15 (!) 143/63    Lab results reviewed with patient   ASSESSMENT AND PLAN:  Discussed the following assessment and plan:  Visit for preventive health examination - Plan: Lipid panel, PSA, Comprehensive metabolic  panel  Medication management - Plan: Lipid panel, Comprehensive metabolic panel  Insomnia, unspecified type - med most tims benefit risk  - Plan: Lipid panel, Comprehensive metabolic panel  Low HDL (under 40) - Plan: Lipid panel, PSA, Comprehensive metabolic panel  Need for influenza vaccination - Plan: Flu Vaccine QUAD 6+ mos PF IM (Fluarix Quad PF)  Screening for malignant neoplasm of prostate - Plan: PSA  Borderline high blood pressure - Plan: Lipid panel, Comprehensive metabolic panel  Malignant neoplasm of urinary bladder, unspecified site (HCC)  COLONIC POLYPS, HX OF  Dysfunction of Eustachian tube, unspecified laterality - assoc with uri?  fu if persitent  Risk benefit of medication discussed.  Patient Care Team: Panosh, Standley Brooking, MD as PCP - Alison Stalling, MD as Attending Physician (Urology) Patient Instructions   Will notify you  of labs when available.   Suspect decreased hearing is related to eustachian tube dysfunction from the head cold congestion.  Your eardrums do not look infected.  Give this more time and if the hearing has not improved after the next 3-4 weeks and contact us for revisit.  Due for colonscopy 10 19   Try 1% hydrocortisone twice a day for 2 weeks on the itchy areas as discussed follow-up if persistent or progressive as needed.  bp readings bid for 5 + days    send in goal 120/80    Preventive Care 40-64 Years, Male Preventive care refers to lifestyle choices and visits with your health care provider that can promote health and wellness. What does preventive care include?  A yearly physical exam. This is also called an annual well check.  Dental exams once or twice a year.  Routine eye exams. Ask your health care provider how often you should have your eyes checked.  Personal lifestyle choices, including: ? Daily care of your teeth and gums. ? Regular physical activity. ? Eating a healthy diet. ? Avoiding tobacco and  drug use. ? Limiting alcohol use. ? Practicing safe sex. ? Taking low-dose aspirin every day starting at age 36. What happens during an annual well check? The services and screenings done by your health care provider during your annual well check will depend on your age, overall health,  lifestyle risk factors, and family history of disease. Counseling Your health care provider may ask you questions about your:  Alcohol use.  Tobacco use.  Drug use.  Emotional well-being.  Home and relationship well-being.  Sexual activity.  Eating habits.  Work and work Statistician.  Screening You may have the following tests or measurements:  Height, weight, and BMI.  Blood pressure.  Lipid and cholesterol levels. These may be checked every 5 years, or more frequently if you are over 91 years old.  Skin check.  Lung cancer screening. You may have this screening every year starting at age 39 if you have a 30-pack-year history of smoking and currently smoke or have quit within the past 15 years.  Fecal occult blood test (FOBT) of the stool. You may have this test every year starting at age 33.  Flexible sigmoidoscopy or colonoscopy. You may have a sigmoidoscopy every 5 years or a colonoscopy every 10 years starting at age 22.  Prostate cancer screening. Recommendations will vary depending on your family history and other risks.  Hepatitis C blood test.  Hepatitis B blood test.  Sexually transmitted disease (STD) testing.  Diabetes screening. This is done by checking your blood sugar (glucose) after you have not eaten for a while (fasting). You may have this done every 1-3 years.  Discuss your test results, treatment options, and if necessary, the need for more tests with your health care provider. Vaccines Your health care provider may recommend certain vaccines, such as:  Influenza vaccine. This is recommended every year.  Tetanus, diphtheria, and acellular pertussis (Tdap,  Td) vaccine. You may need a Td booster every 10 years.  Varicella vaccine. You may need this if you have not been vaccinated.  Zoster vaccine. You may need this after age 16.  Measles, mumps, and rubella (MMR) vaccine. You may need at least one dose of MMR if you were born in 1957 or later. You may also need a second dose.  Pneumococcal 13-valent conjugate (PCV13) vaccine. You may need this if you have certain conditions and have not been vaccinated.  Pneumococcal polysaccharide (PPSV23) vaccine. You may need one or two doses if you smoke cigarettes or if you have certain conditions.  Meningococcal vaccine. You may need this if you have certain conditions.  Hepatitis A vaccine. You may need this if you have certain conditions or if you travel or work in places where you may be exposed to hepatitis A.  Hepatitis B vaccine. You may need this if you have certain conditions or if you travel or work in places where you may be exposed to hepatitis B.  Haemophilus influenzae type b (Hib) vaccine. You may need this if you have certain risk factors.  Talk to your health care provider about which screenings and vaccines you need and how often you need them. This information is not intended to replace advice given to you by your health care provider. Make sure you discuss any questions you have with your health care provider. Document Released: 11/06/2015 Document Revised: 06/29/2016 Document Reviewed: 08/11/2015 Elsevier Interactive Patient Education  2017 [place] DASH stands for "Dietary Approaches to Stop Hypertension." The DASH eating plan is a healthy eating plan that has been shown to reduce high blood pressure (hypertension). It may also reduce your risk for type 2 diabetes, heart disease, and stroke. The DASH eating plan may also help with weight loss. What are tips for following this plan? General guidelines  Avoid eating more than 2,300 mg (milligrams) of salt  (sodium) a day. If you have hypertension, you may need to reduce your sodium intake to 1,500 mg a day.  Limit alcohol intake to no more than 1 drink a day for nonpregnant women and 2 drinks a day for men. One drink equals 12 oz of beer, 5 oz of wine, or 1 oz of hard liquor.  Work with your health care provider to maintain a healthy body weight or to lose weight. Ask what an ideal weight is for you.  Get at least 30 minutes of exercise that causes your heart to beat faster (aerobic exercise) most days of the week. Activities may include walking, swimming, or biking.  Work with your health care provider or diet and nutrition specialist (dietitian) to adjust your eating plan to your individual calorie needs. Reading food labels  Check food labels for the amount of sodium per serving. Choose foods with less than 5 percent of the Daily Value of sodium. Generally, foods with less than 300 mg of sodium per serving fit into this eating plan.  To find whole grains, look for the word "whole" as the first word in the ingredient list. Shopping  Buy products labeled as "low-sodium" or "no salt added."  Buy fresh foods. Avoid canned foods and premade or frozen meals. Cooking  Avoid adding salt when cooking. Use salt-free seasonings or herbs instead of table salt or sea salt. Check with your health care provider or pharmacist before using salt substitutes.  Do not fry foods. Cook foods using healthy methods such as baking, boiling, grilling, and broiling instead.  Cook with heart-healthy oils, such as olive, canola, soybean, or sunflower oil. Meal planning   Eat a balanced diet that includes: ? 5 or more servings of fruits and vegetables each day. At each meal, try to fill half of your plate with fruits and vegetables. ? Up to 6-8 servings of whole grains each day. ? Less than 6 oz of lean meat, poultry, or fish each day. A 3-oz serving of meat is about the same size as a deck of cards. One egg  equals 1 oz. ? 2 servings of low-fat dairy each day. ? A serving of nuts, seeds, or beans 5 times each week. ? Heart-healthy fats. Healthy fats called Omega-3 fatty acids are found in foods such as flaxseeds and coldwater fish, like sardines, salmon, and mackerel.  Limit how much you eat of the following: ? Canned or prepackaged foods. ? Food that is high in trans fat, such as fried foods. ? Food that is high in saturated fat, such as fatty meat. ? Sweets, desserts, sugary drinks, and other foods with added sugar. ? Full-fat dairy products.  Do not salt foods before eating.  Try to eat at least 2 vegetarian meals each week.  Eat more home-cooked food and less restaurant, buffet, and fast food.  When eating at a restaurant, ask that your food be prepared with less salt or no salt, if possible. What foods are recommended? The items listed may not be a complete list. Talk with your dietitian about what dietary choices are best for you. Grains Whole-grain or whole-wheat bread. Whole-grain or whole-wheat pasta. Brown rice. Oatmeal. Quinoa. Bulgur. Whole-grain and low-sodium cereals. Pita bread. Low-fat, low-sodium crackers. Whole-wheat flour tortillas. Vegetables Fresh or frozen vegetables (raw, steamed, roasted, or grilled). Low-sodium or reduced-sodium tomato and vegetable juice. Low-sodium or reduced-sodium tomato sauce and tomato paste. Low-sodium or reduced-sodium canned vegetables. Fruits   All fresh, dried, or frozen fruit. Canned fruit in natural juice (without added sugar). Meat and other protein foods Skinless chicken or [place]. Ground chicken or [place]. Pork with fat trimmed off. Fish and seafood. Egg whites. Dried beans, peas, or lentils. Unsalted nuts, nut butters, and seeds. Unsalted canned beans. Lean cuts of beef with fat trimmed off. Low-sodium, lean deli meat. Dairy Low-fat (1%) or fat-free (skim) milk. Fat-free, low-fat, or reduced-fat cheeses. Nonfat, low-sodium ricotta or  cottage cheese. Low-fat or nonfat yogurt. Low-fat, low-sodium cheese. Fats and oils Soft margarine without trans fats. Vegetable oil. Low-fat, reduced-fat, or light mayonnaise and salad dressings (reduced-sodium). Canola, safflower, olive, soybean, and sunflower oils. Avocado. Seasoning and other foods Herbs. Spices. Seasoning mixes without salt. Unsalted popcorn and pretzels. Fat-free sweets. What foods are not recommended? The items listed may not be a complete list. Talk with your dietitian about what dietary choices are best for you. Grains Baked goods made with fat, such as croissants, muffins, or some breads. Dry pasta or rice meal packs. Vegetables Creamed or fried vegetables. Vegetables in a cheese sauce. Regular canned vegetables (not low-sodium or reduced-sodium). Regular canned tomato sauce and paste (not low-sodium or reduced-sodium). Regular tomato and vegetable juice (not low-sodium or reduced-sodium). Angie Fava. Olives. Fruits Canned fruit in a light or heavy syrup. Fried fruit. Fruit in cream or butter sauce. Meat and other protein foods Fatty cuts of meat. Ribs. Fried meat. Berniece Salines. Sausage. Bologna and other processed lunch meats. Salami. Fatback. Hotdogs. Bratwurst. Salted nuts and seeds. Canned beans with added salt. Canned or smoked fish. Whole eggs or egg yolks. Chicken or [place] with skin. Dairy Whole or 2% milk, cream, and half-and-half. Whole or full-fat cream cheese. Whole-fat or sweetened yogurt. Full-fat cheese. Nondairy creamers. Whipped toppings. Processed cheese and cheese spreads. Fats and oils Butter. Stick margarine. Lard. Shortening. Ghee. Bacon fat. Tropical oils, such as coconut, palm kernel, or palm oil. Seasoning and other foods Salted popcorn and pretzels. Onion salt, garlic salt, seasoned salt, table salt, and sea salt. Worcestershire sauce. Tartar sauce. Barbecue sauce. Teriyaki sauce. Soy sauce, including reduced-sodium. Steak sauce. Canned and packaged  gravies. Fish sauce. Oyster sauce. Cocktail sauce. Horseradish that you find on the shelf. Ketchup. Mustard. Meat flavorings and tenderizers. Bouillon cubes. Hot sauce and Tabasco sauce. Premade or packaged marinades. Premade or packaged taco seasonings. Relishes. Regular salad dressings. Where to find more information:  National Heart, Lung, and [place]: https://wilson-eaton.com/  American Heart Association: www.heart.org Summary  The DASH eating plan is a healthy eating plan that has been shown to reduce high blood pressure (hypertension). It may also reduce your risk for type 2 diabetes, heart disease, and stroke.  With the DASH eating plan, you should limit salt (sodium) intake to 2,300 mg a day. If you have hypertension, you may need to reduce your sodium intake to 1,500 mg a day.  When on the DASH eating plan, aim to eat more fresh fruits and vegetables, whole grains, lean proteins, low-fat dairy, and heart-healthy fats.  Work with your health care provider or diet and nutrition specialist (dietitian) to adjust your eating plan to your individual calorie needs. This information is not intended to replace advice given to you by your health care provider. Make sure you discuss any questions you have with your health care provider. Document Released: 09/29/2011 Document Revised: 10/03/2016 Document Reviewed: 10/03/2016 Elsevier Interactive Patient Education  2017 [place] K. Panosh M.D.

## 2017-07-14 ENCOUNTER — Encounter: Payer: Self-pay | Admitting: Internal Medicine

## 2017-07-14 ENCOUNTER — Ambulatory Visit (INDEPENDENT_AMBULATORY_CARE_PROVIDER_SITE_OTHER): Payer: 59 | Admitting: Internal Medicine

## 2017-07-14 VITALS — BP 140/86 | HR 64 | Temp 98.1°F | Ht 72.75 in | Wt 196.0 lb

## 2017-07-14 DIAGNOSIS — H6980 Other specified disorders of Eustachian tube, unspecified ear: Secondary | ICD-10-CM

## 2017-07-14 DIAGNOSIS — G4700 Insomnia, unspecified: Secondary | ICD-10-CM | POA: Diagnosis not present

## 2017-07-14 DIAGNOSIS — Z0000 Encounter for general adult medical examination without abnormal findings: Secondary | ICD-10-CM

## 2017-07-14 DIAGNOSIS — Z23 Encounter for immunization: Secondary | ICD-10-CM

## 2017-07-14 DIAGNOSIS — C679 Malignant neoplasm of bladder, unspecified: Secondary | ICD-10-CM

## 2017-07-14 DIAGNOSIS — Z86010 Personal history of colonic polyps: Secondary | ICD-10-CM

## 2017-07-14 DIAGNOSIS — R030 Elevated blood-pressure reading, without diagnosis of hypertension: Secondary | ICD-10-CM | POA: Diagnosis not present

## 2017-07-14 DIAGNOSIS — E786 Lipoprotein deficiency: Secondary | ICD-10-CM

## 2017-07-14 DIAGNOSIS — Z125 Encounter for screening for malignant neoplasm of prostate: Secondary | ICD-10-CM

## 2017-07-14 DIAGNOSIS — Z79899 Other long term (current) drug therapy: Secondary | ICD-10-CM | POA: Diagnosis not present

## 2017-07-14 LAB — COMPREHENSIVE METABOLIC PANEL
ALBUMIN: 4.6 g/dL (ref 3.5–5.2)
ALT: 40 U/L (ref 0–53)
AST: 20 U/L (ref 0–37)
Alkaline Phosphatase: 60 U/L (ref 39–117)
BUN: 14 mg/dL (ref 6–23)
CHLORIDE: 102 meq/L (ref 96–112)
CO2: 32 meq/L (ref 19–32)
CREATININE: 0.86 mg/dL (ref 0.40–1.50)
Calcium: 9.6 mg/dL (ref 8.4–10.5)
GFR: 97.43 mL/min (ref >60.00)
GLUCOSE: 108 mg/dL — AB (ref 70–99)
Potassium: 3.9 mEq/L (ref 3.5–5.1)
SODIUM: 140 meq/L (ref 135–145)
Total Bilirubin: 0.9 mg/dL (ref 0.2–1.2)
Total Protein: 7.1 g/dL (ref 6.0–8.3)

## 2017-07-14 LAB — LIPID PANEL
CHOL/HDL RATIO: 5
Cholesterol: 170 mg/dL (ref 0–200)
HDL: 37.7 mg/dL — ABNORMAL LOW (ref >39.00)
LDL Cholesterol: 115 mg/dL — ABNORMAL HIGH (ref 0–99)
NonHDL: 132.6
TRIGLYCERIDES: 89 mg/dL (ref 0.0–149.0)
VLDL: 17.8 mg/dL (ref 0.0–40.0)

## 2017-07-14 LAB — PSA: PSA: 1.8 ng/mL (ref 0.10–4.00)

## 2017-07-14 MED ORDER — ZOLPIDEM TARTRATE 10 MG PO TABS: 10.0000 mg | ORAL_TABLET | Freq: Every evening | ORAL | 2 refills | Status: DC | PRN

## 2017-07-14 NOTE — Patient Instructions (Addendum)
Will notify you  of labs when available.   Suspect decreased hearing is related to eustachian tube dysfunction from the head cold congestion.  Your eardrums do not look infected.  Give this more time and if the hearing has not improved after the next 3-4 weeks and contact us for revisit.  Due for colonscopy 10 19   Try 1% hydrocortisone twice a day for 2 weeks on the itchy areas as discussed follow-up if persistent or progressive as needed.  bp readings bid for 5 + days    send in goal 120/80    Preventive Care 40-64 Years, Male Preventive care refers to lifestyle choices and visits with your health care provider that can promote health and wellness. What does preventive care include?  A yearly physical exam. This is also called an annual well check.  Dental exams once or twice a year.  Routine eye exams. Ask your health care provider how often you should have your eyes checked.  Personal lifestyle choices, including: ? Daily care of your teeth and gums. ? Regular physical activity. ? Eating a healthy diet. ? Avoiding tobacco and drug use. ? Limiting alcohol use. ? Practicing safe sex. ? Taking low-dose aspirin every day starting at age 72. What happens during an annual well check? The services and screenings done by your health care provider during your annual well check will depend on your age, overall health, lifestyle risk factors, and family history of disease. Counseling Your health care provider may ask you questions about your:  Alcohol use.  Tobacco use.  Drug use.  Emotional well-being.  Home and relationship well-being.  Sexual activity.  Eating habits.  Work and work Statistician.  Screening You may have the following tests or measurements:  Height, weight, and BMI.  Blood pressure.  Lipid and cholesterol levels. These may be checked every 5 years, or more frequently if you are over 56 years old.  Skin check.  Lung cancer screening. You  may have this screening every year starting at age 59 if you have a 30-pack-year history of smoking and currently smoke or have quit within the past 15 years.  Fecal occult blood test (FOBT) of the stool. You may have this test every year starting at age 28.  Flexible sigmoidoscopy or colonoscopy. You may have a sigmoidoscopy every 5 years or a colonoscopy every 10 years starting at age 52.  Prostate cancer screening. Recommendations will vary depending on your family history and other risks.  Hepatitis C blood test.  Hepatitis B blood test.  Sexually transmitted disease (STD) testing.  Diabetes screening. This is done by checking your blood sugar (glucose) after you have not eaten for a while (fasting). You may have this done every 1-3 years.  Discuss your test results, treatment options, and if necessary, the need for more tests with your health care provider. Vaccines Your health care provider may recommend certain vaccines, such as:  Influenza vaccine. This is recommended every year.  Tetanus, diphtheria, and acellular pertussis (Tdap, Td) vaccine. You may need a Td booster every 10 years.  Varicella vaccine. You may need this if you have not been vaccinated.  Zoster vaccine. You may need this after age 95.  Measles, mumps, and rubella (MMR) vaccine. You may need at least one dose of MMR if you were born in 1957 or later. You may also need a second dose.  Pneumococcal 13-valent conjugate (PCV13) vaccine. You may need this if you have certain conditions and have not  been vaccinated.  Pneumococcal polysaccharide (PPSV23) vaccine. You may need one or two doses if you smoke cigarettes or if you have certain conditions.  Meningococcal vaccine. You may need this if you have certain conditions.  Hepatitis A vaccine. You may need this if you have certain conditions or if you travel or work in places where you may be exposed to hepatitis A.  Hepatitis B vaccine. You may need this if  you have certain conditions or if you travel or work in places where you may be exposed to hepatitis B.  Haemophilus influenzae type b (Hib) vaccine. You may need this if you have certain risk factors.  Talk to your health care provider about which screenings and vaccines you need and how often you need them. This information is not intended to replace advice given to you by your health care provider. Make sure you discuss any questions you have with your health care provider. Document Released: 11/06/2015 Document Revised: 06/29/2016 Document Reviewed: 08/11/2015 Elsevier Interactive Patient Education  2017 [employer] DASH stands for "Dietary Approaches to Stop Hypertension." The DASH eating plan is a healthy eating plan that has been shown to reduce high blood pressure (hypertension). It may also reduce your risk for type 2 diabetes, heart disease, and stroke. The DASH eating plan may also help with weight loss. What are tips for following this plan? General guidelines  Avoid eating more than 2,300 mg (milligrams) of salt (sodium) a day. If you have hypertension, you may need to reduce your sodium intake to 1,500 mg a day.  Limit alcohol intake to no more than 1 drink a day for nonpregnant women and 2 drinks a day for men. One drink equals 12 oz of beer, 5 oz of wine, or 1 oz of hard liquor.  Work with your health care provider to maintain a healthy body weight or to lose weight. Ask what an ideal weight is for you.  Get at least 30 minutes of exercise that causes your heart to beat faster (aerobic exercise) most days of the week. Activities may include walking, swimming, or biking.  Work with your health care provider or diet and nutrition specialist (dietitian) to adjust your eating plan to your individual calorie needs. Reading food labels  Check food labels for the amount of sodium per serving. Choose foods with less than 5 percent of the Daily Value of sodium.  Generally, foods with less than 300 mg of sodium per serving fit into this eating plan.  To find whole grains, look for the word "whole" as the first word in the ingredient list. Shopping  Buy products labeled as "low-sodium" or "no salt added."  Buy fresh foods. Avoid canned foods and premade or frozen meals. Cooking  Avoid adding salt when cooking. Use salt-free seasonings or herbs instead of table salt or sea salt. Check with your health care provider or pharmacist before using salt substitutes.  Do not fry foods. Cook foods using healthy methods such as baking, boiling, grilling, and broiling instead.  Cook with heart-healthy oils, such as olive, canola, soybean, or sunflower oil. Meal planning   Eat a balanced diet that includes: ? 5 or more servings of fruits and vegetables each day. At each meal, try to fill half of your plate with fruits and vegetables. ? Up to 6-8 servings of whole grains each day. ? Less than 6 oz of lean meat, poultry, or fish each day. A 3-oz serving of meat is  about the same size as a deck of cards. One egg equals 1 oz. ? 2 servings of low-fat dairy each day. ? A serving of nuts, seeds, or beans 5 times each week. ? Heart-healthy fats. Healthy fats called Omega-3 fatty acids are found in foods such as flaxseeds and coldwater fish, like sardines, salmon, and mackerel.  Limit how much you eat of the following: ? Canned or prepackaged foods. ? Food that is high in trans fat, such as fried foods. ? Food that is high in saturated fat, such as fatty meat. ? Sweets, desserts, sugary drinks, and other foods with added sugar. ? Full-fat dairy products.  Do not salt foods before eating.  Try to eat at least 2 vegetarian meals each week.  Eat more home-cooked food and less restaurant, buffet, and fast food.  When eating at a restaurant, ask that your food be prepared with less salt or no salt, if possible. What foods are recommended? The items listed may  not be a complete list. Talk with your dietitian about what dietary choices are best for you. Grains Whole-grain or whole-wheat bread. Whole-grain or whole-wheat pasta. Brown rice. [place]. Bulgur. Whole-grain and low-sodium cereals. Pita bread. Low-fat, low-sodium crackers. Whole-wheat flour tortillas. Vegetables Fresh or frozen vegetables (raw, steamed, roasted, or grilled). Low-sodium or reduced-sodium tomato and vegetable juice. Low-sodium or reduced-sodium tomato sauce and tomato paste. Low-sodium or reduced-sodium canned vegetables. Fruits All fresh, dried, or frozen fruit. Canned fruit in natural juice (without added sugar). Meat and other protein foods Skinless chicken or [place]. Ground chicken or [place]. Pork with fat trimmed off. Fish and seafood. Egg whites. Dried beans, peas, or lentils. Unsalted nuts, nut butters, and seeds. Unsalted canned beans. Lean cuts of beef with fat trimmed off. Low-sodium, lean deli meat. Dairy Low-fat (1%) or fat-free (skim) milk. Fat-free, low-fat, or reduced-fat cheeses. Nonfat, low-sodium ricotta or cottage cheese. Low-fat or nonfat yogurt. Low-fat, low-sodium cheese. Fats and oils Soft margarine without trans fats. Vegetable oil. Low-fat, reduced-fat, or light mayonnaise and salad dressings (reduced-sodium). Canola, safflower, olive, soybean, and sunflower oils. Avocado. Seasoning and other foods Herbs. Spices. Seasoning mixes without salt. Unsalted popcorn and pretzels. Fat-free sweets. What foods are not recommended? The items listed may not be a complete list. Talk with your dietitian about what dietary choices are best for you. Grains Baked goods made with fat, such as croissants, muffins, or some breads. Dry pasta or rice meal packs. Vegetables Creamed or fried vegetables. Vegetables in a cheese sauce. Regular canned vegetables (not low-sodium or reduced-sodium). Regular canned tomato sauce and paste (not low-sodium or reduced-sodium).  Regular tomato and vegetable juice (not low-sodium or reduced-sodium). Angie Fava. Olives. Fruits Canned fruit in a light or heavy syrup. Fried fruit. Fruit in cream or butter sauce. Meat and other protein foods Fatty cuts of meat. Ribs. Fried meat. Berniece Salines. Sausage. Bologna and other processed lunch meats. Salami. Fatback. Hotdogs. Bratwurst. Salted nuts and seeds. Canned beans with added salt. Canned or smoked fish. Whole eggs or egg yolks. Chicken or [place] with skin. Dairy Whole or 2% milk, cream, and half-and-half. Whole or full-fat cream cheese. Whole-fat or sweetened yogurt. Full-fat cheese. Nondairy creamers. Whipped toppings. Processed cheese and cheese spreads. Fats and oils Butter. Stick margarine. Lard. Shortening. Ghee. Bacon fat. Tropical oils, such as coconut, palm kernel, or palm oil. Seasoning and other foods Salted popcorn and pretzels. Onion salt, garlic salt, seasoned salt, table salt, and sea salt. Worcestershire sauce. Tartar sauce. Barbecue sauce. Teriyaki sauce. Soy  sauce, including reduced-sodium. Steak sauce. Canned and packaged gravies. Fish sauce. Oyster sauce. Cocktail sauce. Horseradish that you find on the shelf. Ketchup. Mustard. Meat flavorings and tenderizers. Bouillon cubes. Hot sauce and Tabasco sauce. Premade or packaged marinades. Premade or packaged taco seasonings. Relishes. Regular salad dressings. Where to find more information:  National Heart, Lung, and [place]: https://wilson-eaton.com/  American Heart Association: www.heart.org Summary  The DASH eating plan is a healthy eating plan that has been shown to reduce high blood pressure (hypertension). It may also reduce your risk for type 2 diabetes, heart disease, and stroke.  With the DASH eating plan, you should limit salt (sodium) intake to 2,300 mg a day. If you have hypertension, you may need to reduce your sodium intake to 1,500 mg a day.  When on the DASH eating plan, aim to eat more fresh fruits and  vegetables, whole grains, lean proteins, low-fat dairy, and heart-healthy fats.  Work with your health care provider or diet and nutrition specialist (dietitian) to adjust your eating plan to your individual calorie needs. This information is not intended to replace advice given to you by your health care provider. Make sure you discuss any questions you have with your health care provider. Document Released: 09/29/2011 Document Revised: 10/03/2016 Document Reviewed: 10/03/2016 Elsevier Interactive Patient Education  2017 [employer].

## 2017-07-20 ENCOUNTER — Encounter (INDEPENDENT_AMBULATORY_CARE_PROVIDER_SITE_OTHER): Payer: Self-pay

## 2017-10-07 ENCOUNTER — Other Ambulatory Visit: Payer: Self-pay | Admitting: Internal Medicine

## 2017-10-09 NOTE — Telephone Encounter (Signed)
Last filled 07/14/17, Ambien 10mg  #30 x 2 refills.  Last seen 07/14/17 No upcoming visit scheduled.   Please advise Dr Sarajane Jews if you are able to authorize a refill in Dr Velora Mediate absence. Thanks.

## 2017-10-10 NOTE — Telephone Encounter (Signed)
Call in #30 with no rf  

## 2017-10-11 NOTE — Telephone Encounter (Signed)
Rx phoned in.  Nothing further needed.  

## 2017-11-06 NOTE — Progress Notes (Signed)
Chief Complaint  Patient presents with  . Abdominal Pain    pt states that pain in around gallbladder(removed 07/2015) . Denies N/V/F. Has had some night sweats. Some diarrhea x 3 weeks, abnormal stools. Pt states he keeps a steady diet.     HPI: [name] 57 y.o.   SDA  New problem    Onset about 3 weeks and    Pain near   Right side . Upper  More than lower abdomen assoc with watery diarrhea with urgency ;no blood or fever   Pain j ust like when had gb issues .  Diarrhea first .  Before pain  Now  Less lose but not normal and pain is beter but persistes    baseline bid stool but now mushy   ROS: See pertinent positives and negatives per HPI. No fever but ocass nausea .   Some sweats  Last week.  And sinus issues   Past hx of chole and diverticulitis osis   utd on colon?  Had cysto today and bladder has checked out  Mentioned pain but not  Evaluated for this.   Surgeon  That did  Chole said check out here. First .   Please refill ambien taking night ly   Only ocass etoh no change diet  Tobacco    Past Medical History:  Diagnosis Date  . Bladder cancer ([place]) 2013   RECURRENT (HX  TURBT  2013)  . Borderline hypertension   . BPH (benign prostatic hyperplasia)   . Cholelithiasis    ASYMPTOMATIC  . Diverticulosis of colon   . History of colon polyps   . Insomnia   . Left inguinal hernia   . Lower urinary tract symptoms (LUTS)   . Red skin    nodule under right under arm x 3 days no drainage   . Wears glasses    Past Surgical History:  Procedure Laterality Date  . COLONOSCOPY  2014  . CYSTOSCOPY W/ RETROGRADES Bilateral 02/19/2014   Procedure: CYSTOSCOPY WITH RETROGRADE PYELOGRAM;  Surgeon: Alexis Frock, MD;  Location[facility];  Service: Urology;  Laterality: Bilateral;  . CYSTOSCOPY W/ RETROGRADES Bilateral 06/18/2014   Procedure: CYSTOSCOPY WITH RETROGRADE PYELOGRAM;  Surgeon: Alexis Frock, MD;  Location: [facility];   Service: Urology;  Laterality: Bilateral;  . CYSTOSCOPY WITH RETROGRADE PYELOGRAM, URETEROSCOPY AND STENT PLACEMENT  10/03/2012   Procedure: CYSTOSCOPY WITH RETROGRADE PYELOGRAM, URETEROSCOPY AND STENT PLACEMENT;  Surgeon: Alexis Frock, MD;  Location: [facility];  Service: Urology;  Laterality: Left;    . HERNIA REPAIR  as child   right inguinal  . LAPAROSCOPIC CHOLECYSTECTOMY SINGLE SITE WITH INTRAOPERATIVE CHOLANGIOGRAM N/A 08/20/2015   Procedure: LAPAROSCOPIC CHOLECYSTECTOMY SINGLE SITE WITH INTRAOPERATIVE CHOLANGIOGRAM;  Surgeon: Michael Boston, MD;  Location: [facility];  Service: General;  Laterality: N/A;  . TONSILLECTOMY  as child  . TRANSURETHRAL RESECTION OF BLADDER TUMOR  10/03/2012   Procedure: TRANSURETHRAL RESECTION OF BLADDER TUMOR (TURBT);  Surgeon: Alexis Frock, MD;  Location: [facility];  Service: Urology;  Laterality: N/A;  . TRANSURETHRAL RESECTION OF BLADDER TUMOR WITH GYRUS (TURBT-GYRUS) N/A 02/19/2014   Procedure: TRANSURETHRAL RESECTION OF BLADDER TUMOR WITH GYRUS (TURBT-GYRUS);  Surgeon: Alexis Frock, MD;  Location: [facility];  Service: Urology;  Laterality: N/A;  . TRANSURETHRAL RESECTION OF BLADDER TUMOR WITH GYRUS (TURBT-GYRUS) N/A 06/18/2014   Procedure: TRANSURETHRAL RESECTION OF BLADDER TUMOR WITH GYRUS (TURBT-GYRUS) WITH MITOMYCIN INSTILLATION;  Surgeon: Alexis Frock, MD;  Location: [facility];  Service: Urology;  Laterality: N/A;    Family History  Problem Relation Age of Onset  . Lung cancer Mother   . Colon cancer Unknown        fathers side   . ADD / ADHD Son     Social History   Socioeconomic History  . Marital status: Married    Spouse name: None  . Number of children: None  . Years of education: None  . Highest education level: None  Social Needs  . Financial resource strain: None  . Food insecurity - worry: None  . Food insecurity - inability: None  . Transportation needs -  medical: None  . Transportation needs - non-medical: None  Occupational History  . None  Tobacco Use  . Smoking status: Former Smoker    Packs/day: 1.00    Years: 10.00    Pack years: 10.00    Types: Cigarettes    Last attempt to quit: 10/24/2001    Years since quitting: 16.0  . Smokeless tobacco: Never Used  Substance and Sexual Activity  . Alcohol use: No    Alcohol/week: 1.2 oz    Types: 2 Standard drinks or equivalent per week  . Drug use: No  . Sexual activity: None  Other Topics Concern  . 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 EPINEPHrine (EPIPEN) 0.3 mg/0.3 mL DEVI Inject 0.3 mLs (0.3 mg total) into the muscle once. 1 Device 0  . zolpidem (AMBIEN) 10 MG tablet TAKE 1 TABLET AT BEDTIME AS NEEDED FOR SLEEP. 30 tablet 0  . cyclobenzaprine (FLEXERIL) 10 MG tablet TAKE 1 TABLET THREE TIMES DAILY AS NEEDED FOR MUSCLE SPASMS. (Patient not taking: Reported on 11/07/2017) 40 tablet 0   No facility-administered medications prior to visit.      EXAM:  BP (!) 142/98 (BP Location: Left Arm, Patient Position: Sitting, Cuff Size: Normal)   Pulse 72   Temp 98.2 F (36.8 C) (Oral)   Wt 208 lb 9.6 oz (94.6 kg)   BMI 27.71 kg/m   Body mass index is 27.71 kg/m.  GENERAL: vitals reviewed and listed above, alert, oriented, appears well hydrated and in no acute distress HEENT: atraumatic, conjunctiva  clear, no obvious abnormalities on inspection of external nose and ears OP : no lesion edema or exudate  NECK: no obvious masses on inspection palpation  LUNGS: clear to auscultation bilaterally, no wheezes, rales or rhonchi, CV: HRRR, no clubbing cyanosis or  peripheral edema nl cap refill  Abdomen:  Sof,t  Tender ruq and sligh rlq  No g r no masses noted  normal bowel  sounds without hepatosplenomegaly, no guarding rebound or masses ?  CVA tenderness MS: moves all extremities without noticeable focal  abnormality PSYCH: pleasant and cooperative, no obvious depression or anxiety Skin non icteric  Nl cap refill   ASSESSMENT AND PLAN:  Discussed the following assessment and plan:  Right upper quadrant abdominal pain - Plan: CBC with Differential/Platelet, Basic metabolic panel, Hepatic function panel, Sedimentation rate  Diarrhea, unspecified type - Plan: CBC with Differential/Platelet, Basic metabolic panel, Hepatic function panel, Sedimentation rate  Medication management  Insomnia, unspecified type  Malignant neoplasm of urinary bladder, unspecified site (  [place])  hx of  - clear cyto today  Hx bladder cancer  Just had eval doing apparently as expected no recurrence   Lab today and if   persistent or progressive   Gi eval   and poss imaging  . Ok to try  Probiotic    Ok to refill the [place]  -Patient advised to return or notify health care team  if symptoms worsen ,persist or new concerns arise.  Patient Instructions  This may be a bowel infection  Resolving since getting better   Labs today  But if   persistent or progressive we may need to get another ct scan or see the Gi specialist .   Depending on lab  And how you are doing  We will plan follow up       Lawrence. Panosh M.D.

## 2017-11-07 ENCOUNTER — Encounter: Payer: Self-pay | Admitting: Internal Medicine

## 2017-11-07 ENCOUNTER — Ambulatory Visit: Payer: 59 | Admitting: Internal Medicine

## 2017-11-07 VITALS — BP 142/98 | HR 72 | Temp 98.2°F | Wt 208.6 lb

## 2017-11-07 DIAGNOSIS — Z79899 Other long term (current) drug therapy: Secondary | ICD-10-CM | POA: Diagnosis not present

## 2017-11-07 DIAGNOSIS — C679 Malignant neoplasm of bladder, unspecified: Secondary | ICD-10-CM | POA: Diagnosis not present

## 2017-11-07 DIAGNOSIS — G4700 Insomnia, unspecified: Secondary | ICD-10-CM | POA: Diagnosis not present

## 2017-11-07 DIAGNOSIS — R197 Diarrhea, unspecified: Secondary | ICD-10-CM | POA: Diagnosis not present

## 2017-11-07 DIAGNOSIS — R1011 Right upper quadrant pain: Secondary | ICD-10-CM

## 2017-11-07 LAB — HEPATIC FUNCTION PANEL
ALBUMIN: 4.6 g/dL (ref 3.5–5.2)
ALK PHOS: 57 U/L (ref 39–117)
ALT: 19 U/L (ref 0–53)
AST: 15 U/L (ref 0–37)
Bilirubin, Direct: 0.2 mg/dL (ref 0.0–0.3)
TOTAL PROTEIN: 7.3 g/dL (ref 6.0–8.3)
Total Bilirubin: 1 mg/dL (ref 0.2–1.2)

## 2017-11-07 LAB — SEDIMENTATION RATE: Sed Rate: 2 mm/hr (ref 0–20)

## 2017-11-07 LAB — CBC WITH DIFFERENTIAL/PLATELET
Basophils Absolute: 0 10*3/uL (ref 0.0–0.1)
Basophils Relative: 0.5 % (ref 0.0–3.0)
EOS PCT: 2 % (ref 0.0–5.0)
Eosinophils Absolute: 0.1 10*3/uL (ref 0.0–0.7)
HCT: 43.8 % (ref 39.0–52.0)
HEMOGLOBIN: 15.1 g/dL (ref 13.0–17.0)
Lymphocytes Relative: 31.1 % (ref 12.0–46.0)
Lymphs Abs: 2 10*3/uL (ref 0.7–4.0)
MCHC: 34.6 g/dL (ref 30.0–36.0)
MCV: 93.6 fl (ref 78.0–100.0)
MONO ABS: 0.4 10*3/uL (ref 0.1–1.0)
Monocytes Relative: 6.1 % (ref 3.0–12.0)
Neutro Abs: 3.9 10*3/uL (ref 1.4–7.7)
Neutrophils Relative %: 60.3 % (ref 43.0–77.0)
Platelets: 246 10*3/uL (ref 150.0–400.0)
RBC: 4.68 Mil/uL (ref 4.22–5.81)
RDW: 13.4 % (ref 11.5–15.5)
WBC: 6.4 10*3/uL (ref 4.0–10.5)

## 2017-11-07 LAB — BASIC METABOLIC PANEL
BUN: 15 mg/dL (ref 6–23)
CALCIUM: 9.5 mg/dL (ref 8.4–10.5)
CHLORIDE: 102 meq/L (ref 96–112)
CO2: 30 meq/L (ref 19–32)
Creatinine, Ser: 0.84 mg/dL (ref 0.40–1.50)
GFR: 100 mL/min (ref >60.00)
GLUCOSE: 105 mg/dL — AB (ref 70–99)
Potassium: 4 mEq/L (ref 3.5–5.1)
SODIUM: 139 meq/L (ref 135–145)

## 2017-11-07 MED ORDER — ZOLPIDEM TARTRATE 10 MG PO TABS: 10.0000 mg | ORAL_TABLET | Freq: Every evening | ORAL | 4 refills | Status: DC | PRN

## 2017-11-07 NOTE — Patient Instructions (Signed)
This may be a bowel infection  Resolving since getting better   Labs today  But if   persistent or progressive we may need to get another ct scan or see the Gi specialist .   Depending on lab  And how you are doing  We will plan follow up

## 2018-04-03 ENCOUNTER — Other Ambulatory Visit: Payer: Self-pay | Admitting: Internal Medicine

## 2018-04-03 NOTE — Telephone Encounter (Signed)
Last Ov 11/07/17  Last refilled 11/07/17 # 30 refills 4

## 2018-04-04 NOTE — Telephone Encounter (Signed)
Sent in electronically .  

## 2018-06-21 NOTE — Progress Notes (Unsigned)
Error encounter? Or note

## 2018-07-04 ENCOUNTER — Telehealth: Payer: Self-pay | Admitting: Internal Medicine

## 2018-07-05 NOTE — Telephone Encounter (Signed)
Dr. Regis Bill, please advise of refill of the zolpidem. Thanks

## 2018-07-06 NOTE — Telephone Encounter (Signed)
Due for OV ov.  By protochol  Please   Get on appt  Schedule for   Ov and then refill medication

## 2018-07-08 ENCOUNTER — Other Ambulatory Visit: Payer: Self-pay | Admitting: Internal Medicine

## 2018-07-09 NOTE — Telephone Encounter (Signed)
Pt called in to follow up on refill request. Scheduled pt for CPE on 08/13/18. Pt would like to know if provider could send in Rx as requested?   Please advise.

## 2018-07-10 NOTE — Telephone Encounter (Signed)
Pt called back line to ask why refill has not been sent in yet.

## 2018-07-11 NOTE — Telephone Encounter (Signed)
Pt aware via detailed voicemail that we have sent the Rx to the pharmacy as of about 2pm today and he should be able to pick this up. Apologized for any confusion or delay in getting back with you. Nothing further needed.

## 2018-07-11 NOTE — Telephone Encounter (Signed)
Dr. Regis Bill please advise. Patient is scheduled for physical 08/13/18. Ok to refill Rx?

## 2018-07-11 NOTE — Telephone Encounter (Signed)
Last filled 06/07/18 Upcoming Ov 08/13/18  Please advise Dr Regis Bill, thanks.

## 2018-08-10 NOTE — Progress Notes (Signed)
No chief complaint on file.   HPI: Patient  [name]  57 y.o. comes in today for Preventive Health Care visit  And med management   Feels good t 38     Sees urology .   Dr Tresa Moore for hx of bladder cancer   No sx now   Needs refill Lorrin Mais uses for sleeo and does well   Says bp usually 314 970 diastolic high 26V but not 140/90 and above   Health Maintenance  Topic Date Due  . INFLUENZA VACCINE  05/24/2018  . COLONOSCOPY  08/22/2018  . TETANUS/TDAP  08/04/2020  . Hepatitis C Screening  Completed  . HIV Screening  Completed   Health Maintenance Review LIFESTYLE:  Exercise:   Job working   [employer] walking Tobacco/ETS: no Alcohol:  1-2 every 2 weeks  Sugar beverages: Sleep: always good  Medication  .   Drug use: no HH of 3 cat and dog.   Work:   Over 53   ROS:  GEN/ HEENT: No fever, significant weight changes sweats headaches vision problems glassess  hearing changes, CV/ PULM; No chest pain shortness of breath cough, syncope,edema  change in exercise tolerance. GI /GU: No adominal pain, vomiting, change in bowel habits. No blood in the stool. No significant GU symptoms. SKIN/HEME: ,no acute skin rashes suspicious lesions or bleeding. No lymphadenopathy, nodules, masses.  NEURO/ PSYCH:  No neurologic signs such as weakness numbness. No depression anxiety. IMM/ Allergy: No unusual infections.  Allergy .   REST of 12 system review negative except as per HPI   Past Medical History:  Diagnosis Date  . Bladder cancer ([place]) 2013   RECURRENT (HX  TURBT  2013)  . Borderline hypertension   . BPH (benign prostatic hyperplasia)   . Cholelithiasis    ASYMPTOMATIC  . Diverticulosis of colon   . History of colon polyps   . Insomnia   . Left inguinal hernia   . Lower urinary tract symptoms (LUTS)   . Red skin    nodule under right under arm x 3 days no drainage   . Wears glasses     Past Surgical History:  Procedure Laterality Date  . COLONOSCOPY  2014  . CYSTOSCOPY W/  RETROGRADES Bilateral 02/19/2014   Procedure: CYSTOSCOPY WITH RETROGRADE PYELOGRAM;  Surgeon: Alexis Frock, MD;  [facility];  Service: Urology;  Laterality: Bilateral;  . CYSTOSCOPY W/ RETROGRADES Bilateral 06/18/2014   Procedure: CYSTOSCOPY WITH RETROGRADE PYELOGRAM;  Surgeon: Alexis Frock, MD;  Location: [facility];  Service: Urology;  Laterality: Bilateral;  . CYSTOSCOPY WITH RETROGRADE PYELOGRAM, URETEROSCOPY AND STENT PLACEMENT  10/03/2012   Procedure: CYSTOSCOPY WITH RETROGRADE PYELOGRAM, URETEROSCOPY AND STENT PLACEMENT;  Surgeon: Alexis Frock, MD;  Location: [facility];  Service: Urology;  Laterality: Left;    . HERNIA REPAIR  as child   right inguinal  . LAPAROSCOPIC CHOLECYSTECTOMY SINGLE SITE WITH INTRAOPERATIVE CHOLANGIOGRAM N/A 08/20/2015   Procedure: LAPAROSCOPIC CHOLECYSTECTOMY SINGLE SITE WITH INTRAOPERATIVE CHOLANGIOGRAM;  Surgeon: Michael Boston, MD;  Location: [facility];  Service: General;  Laterality: N/A;  . TONSILLECTOMY  as child  . TRANSURETHRAL RESECTION OF BLADDER TUMOR  10/03/2012   Procedure: TRANSURETHRAL RESECTION OF BLADDER TUMOR (TURBT);  Surgeon: Alexis Frock, MD;  Location: [facility];  Service: Urology;  Laterality: N/A;  . TRANSURETHRAL RESECTION OF BLADDER TUMOR WITH GYRUS (TURBT-GYRUS) N/A 02/19/2014   Procedure: TRANSURETHRAL RESECTION OF BLADDER TUMOR WITH GYRUS (TURBT-GYRUS);  Surgeon: Hubbard Robinson  Tresa Moore, MD;  Location: [facility];  Service: Urology;  Laterality: N/A;  . TRANSURETHRAL RESECTION OF BLADDER TUMOR WITH GYRUS (TURBT-GYRUS) N/A 06/18/2014   Procedure: TRANSURETHRAL RESECTION OF BLADDER TUMOR WITH GYRUS (TURBT-GYRUS) WITH MITOMYCIN INSTILLATION;  Surgeon: Alexis Frock, MD;  Location: [facility];  Service: Urology;  Laterality: N/A;    Family History  Problem Relation Age of Onset  . Lung cancer Mother   . Colon cancer Unknown        fathers  side   . ADD / ADHD Son     Social History   Socioeconomic History  . Marital status: Married    Spouse name: Not on file  . Number of children: Not on file  . Years of education: Not on file  . Highest education level: Not on file  Occupational History  . Not on file  Social Needs  . Financial resource strain: Not on file  . Food insecurity:    Worry: Not on file    Inability: Not on file  . Transportation needs:    Medical: Not on file    Non-medical: Not on file  Tobacco Use  . Smoking status: Former Smoker    Packs/day: 1.00    Years: 10.00    Pack years: 10.00    Types: Cigarettes    Last attempt to quit: 10/24/2001    Years since quitting: 16.8  . Smokeless tobacco: Never Used  Substance and Sexual Activity  . Alcohol use: No    Alcohol/week: 2.0 standard drinks    Types: 2 Standard drinks or equivalent per week  . Drug use: No  . Sexual activity: Not on file  Lifestyle  . Physical activity:    Days per week: Not on file    Minutes per session: Not on file  . Stress: Not on file  Relationships  . Social connections:    Talks on phone: Not on file    Gets together: Not on file    Attends religious service: Not on file    Active member of club or organization: Not on file    Attends meetings of clubs or organizations: Not on file    Relationship status: Not on file  Other Topics Concern  . 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 EPINEPHrine (EPIPEN) 0.3 mg/0.3 mL DEVI Inject 0.3 mLs (0.3 mg total) into the muscle once. 1 Device 0  . zolpidem (AMBIEN) 10 MG tablet TAKE 1 TABLET AT BEDTIME AS NEEDED FOR SLEEP. 30 tablet 0   No facility-administered medications prior to visit.      EXAM:  BP (!) 150/90 (BP Location: Left Arm, Patient  Position: Sitting, Cuff Size: Normal)   Pulse 74   Temp 98 F (36.7 C) (Oral)   Ht 6\' 1"  (1.854 m)   Wt 202 lb 3.2 oz (91.7 kg)   BMI 26.68 kg/m   Body mass index is 26.68 kg/m. Wt Readings from Last 3 Encounters:  08/13/18 202 lb 3.2 oz (91.7 kg)  11/07/17 208 lb 9.6 oz (94.6 kg)  07/14/17 196 lb (88.9 kg)    Physical Exam: Vital signs reviewed YBO:[id number] is a well-developed well-nourished alert cooperative  who appearsr stated age in no acute distress.  HEENT: normocephalic atraumatic , Eyes: PERRL EOM's full, conjunctiva clear, Nares: paten,t no deformity discharge or tenderness., Ears: no deformity EAC's clear TMs with normal landmarks. Mouth: clear OP, no lesions, edema.  Moist mucous membranes. Dentition in adequate repair. NECK: supple without masses, thyromegaly or bruits. CHEST/PULM:  Clear to auscultation and percussion breath sounds equal no wheeze , rales or rhonchi. No chest wall deformities or tenderness. CV: PMI is nondisplaced, S1 S2 no gallops, murmurs, rubs. Peripheral pulses are full without delay.No JVD .  ABDOMEN: Bowel sounds normal nontender  No guard or rebound, no hepato splenomegal no CVA tenderness.  No hernia.well healed lap scars  Extremtities:  No clubbing cyanosis or edema, no acute joint swelling or redness no focal atrophy callous on great toes  NEURO:  Oriented x3, cranial nerves 3-12 appear to be intact, no obvious focal weakness,gait within normal limits no abnormal reflexes or asymmetrical SKIN: No acute rashes normal turgor, color, no bruising or petechiae. PSYCH: Oriented, good eye contact, no obvious depression anxiety, cognition and judgment appear normal. LN: no cervical axillary il adenopathy  Lab Results  Component Value Date   WBC 6.4 11/07/2017   HGB 15.1 11/07/2017   HCT 43.8 11/07/2017   PLT 246.0 11/07/2017   GLUCOSE 105 (H) 11/07/2017   CHOL 170 07/14/2017   TRIG 89.0 07/14/2017   HDL 37.70 (L) 07/14/2017   LDLDIRECT 120.3  07/21/2008   LDLCALC 115 (H) 07/14/2017   ALT 19 11/07/2017   AST 15 11/07/2017   NA 139 11/07/2017   K 4.0 11/07/2017   CL 102 11/07/2017   CREATININE 0.84 11/07/2017   BUN 15 11/07/2017   CO2 30 11/07/2017   TSH 3.80 07/01/2016   PSA 1.80 07/14/2017   HGBA1C 5.1 11/20/2012    BP Readings from Last 3 Encounters:  08/13/18 (!) 150/90  11/07/17 (!) 142/98  07/14/17 140/86      ASSESSMENT AND PLAN:  Discussed the following assessment and plan:  Visit for preventive health examination  Medication management - Plan: Lipid panel, Basic metabolic panel, Hemoglobin A1c  Low HDL (under 40) - Plan: Lipid panel, Basic metabolic panel, Hemoglobin A1c  Hyperglycemia - Plan: Lipid panel, Basic metabolic panel, Hemoglobin A1c  Insomnia, unspecified type  Borderline high blood pressure  Need for immunization against influenza - Plan: Flu Vaccine QUAD 6+ mos PF IM (Fluarix Quad PF) Refill ambien   Risk benefit of medication discussed. Plan fasting labs and fu about 6 mos  dsic goals bp and if trending up then add med but  readdressed lsi  Patient Care Team: Hanya Guerin, Standley Brooking, MD as PCP - Alison Stalling, MD as Attending Physician (Urology) Patient Instructions  See GI  about fu  Colonoscopy  Dr Fuller Plan.     In 6 mos   Fasting Lab and ov for med check .   120/80   Is best   Life style intervention    If below 140/90   Otherwise contact us .    DASH Eating Plan DASH stands for "Dietary Approaches to Stop Hypertension." The DASH eating plan is a healthy eating plan that has been shown to reduce high blood pressure (hypertension). It may also reduce your risk for type 2 diabetes, heart disease, and stroke. The DASH eating plan may also help with weight loss. What are tips for following this plan? General guidelines  Avoid eating more than 2,300 mg (milligrams) of salt (sodium) a day.  If you have hypertension, you may need to reduce your sodium intake to 1,500 mg a  day.  Limit alcohol intake to no more than 1 drink a day for nonpregnant women and 2 drinks a day for men. One drink equals 12 oz of beer, 5 oz of wine, or 1 oz of hard liquor.  Work with your health care provider to maintain a healthy body weight or to lose weight. Ask what an ideal weight is for you.  Get at least 30 minutes of exercise that causes your heart to beat faster (aerobic exercise) most days of the week. Activities may include walking, swimming, or biking.  Work with your health care provider or diet and nutrition specialist (dietitian) to adjust your eating plan to your individual calorie needs. Reading food labels  Check food labels for the amount of sodium per serving. Choose foods with less than 5 percent of the Daily Value of sodium. Generally, foods with less than 300 mg of sodium per serving fit into this eating plan.  To find whole grains, look for the word "whole" as the first word in the ingredient list. Shopping  Buy products labeled as "low-sodium" or "no salt added."  Buy fresh foods. Avoid canned foods and premade or frozen meals. Cooking  Avoid adding salt when cooking. Use salt-free seasonings or herbs instead of table salt or sea salt. Check with your health care provider or pharmacist before using salt substitutes.  Do not fry foods. Cook foods using healthy methods such as baking, boiling, grilling, and broiling instead.  Cook with heart-healthy oils, such as olive, canola, soybean, or sunflower oil. Meal planning   Eat a balanced diet that includes: ? 5 or more servings of fruits and vegetables each day. At each meal, try to fill half of your plate with fruits and vegetables. ? Up to 6-8 servings of whole grains each day. ? Less than 6 oz of lean meat, poultry, or fish each day. A 3-oz serving of meat is about the same size as a deck of cards. One egg equals 1 oz. ? 2 servings of low-fat dairy each day. ? A serving of nuts, seeds, or beans 5 times  each week. ? Heart-healthy fats. Healthy fats called Omega-3 fatty acids are found in foods such as flaxseeds and coldwater fish, like sardines, salmon, and mackerel.  Limit how much you eat of the following: ? Canned or prepackaged foods. ? Food that is high in trans fat, such as fried foods. ? Food that is high in saturated fat, such as fatty meat. ? Sweets, desserts, sugary drinks, and other foods with added sugar. ? Full-fat dairy products.  Do not salt foods before eating.  Try to eat at least 2 vegetarian meals each week.  Eat more home-cooked food and less restaurant, buffet, and fast food.  When eating at a restaurant, ask that your food be prepared with less salt or no salt, if possible. What foods are recommended? The items listed may not be a complete list. Talk with your dietitian about what dietary choices are best for you. Grains Whole-grain or whole-wheat bread. Whole-grain or whole-wheat pasta. Brown rice. [place] Morrow. Bulgur. Whole-grain and low-sodium cereals. Pita bread. Low-fat, low-sodium crackers. Whole-wheat flour tortillas. Vegetables Fresh or frozen vegetables (raw, steamed, roasted, or grilled). Low-sodium or reduced-sodium tomato and vegetable juice. Low-sodium or reduced-sodium tomato sauce and tomato paste. Low-sodium or reduced-sodium canned vegetables. Fruits All fresh, dried, or frozen fruit. Canned fruit in natural juice (without  added sugar). Meat and other protein foods Skinless chicken or [place]. Ground chicken or [place]. Pork with fat trimmed off. Fish and seafood. Egg whites. Dried beans, peas, or lentils. Unsalted nuts, nut butters, and seeds. Unsalted canned beans. Lean cuts of beef with fat trimmed off. Low-sodium, lean deli meat. Dairy Low-fat (1%) or fat-free (skim) milk. Fat-free, low-fat, or reduced-fat cheeses. Nonfat, low-sodium ricotta or cottage cheese. Low-fat or nonfat yogurt. Low-fat, low-sodium cheese. Fats and oils Soft margarine  without trans fats. Vegetable oil. Low-fat, reduced-fat, or light mayonnaise and salad dressings (reduced-sodium). Canola, safflower, olive, soybean, and sunflower oils. Avocado. Seasoning and other foods Herbs. Spices. Seasoning mixes without salt. Unsalted popcorn and pretzels. Fat-free sweets. What foods are not recommended? The items listed may not be a complete list. Talk with your dietitian about what dietary choices are best for you. Grains Baked goods made with fat, such as croissants, muffins, or some breads. Dry pasta or rice meal packs. Vegetables Creamed or fried vegetables. Vegetables in a cheese sauce. Regular canned vegetables (not low-sodium or reduced-sodium). Regular canned tomato sauce and paste (not low-sodium or reduced-sodium). Regular tomato and vegetable juice (not low-sodium or reduced-sodium). Angie Fava. Olives. Fruits Canned fruit in a light or heavy syrup. Fried fruit. Fruit in cream or butter sauce. Meat and other protein foods Fatty cuts of meat. Ribs. Fried meat. Berniece Salines. Sausage. Bologna and other processed lunch meats. Salami. Fatback. Hotdogs. Bratwurst. Salted nuts and seeds. Canned beans with added salt. Canned or smoked fish. Whole eggs or egg yolks. Chicken or [place] with skin. Dairy Whole or 2% milk, cream, and half-and-half. Whole or full-fat cream cheese. Whole-fat or sweetened yogurt. Full-fat cheese. Nondairy creamers. Whipped toppings. Processed cheese and cheese spreads. Fats and oils Butter. Stick margarine. Lard. Shortening. Ghee. Bacon fat. Tropical oils, such as coconut, palm kernel, or palm oil. Seasoning and other foods Salted popcorn and pretzels. Onion salt, garlic salt, seasoned salt, table salt, and sea salt. Worcestershire sauce. Tartar sauce. Barbecue sauce. Teriyaki sauce. Soy sauce, including reduced-sodium. Steak sauce. Canned and packaged gravies. Fish sauce. Oyster sauce. Cocktail sauce. Horseradish that you find on the shelf. Ketchup.  Mustard. Meat flavorings and tenderizers. Bouillon cubes. Hot sauce and Tabasco sauce. Premade or packaged marinades. Premade or packaged taco seasonings. Relishes. Regular salad dressings. Where to find more information:  National Heart, Lung, and [place]: https://wilson-eaton.com/  American Heart Association: www.heart.org Summary  The DASH eating plan is a healthy eating plan that has been shown to reduce high blood pressure (hypertension). It may also reduce your risk for type 2 diabetes, heart disease, and stroke.  With the DASH eating plan, you should limit salt (sodium) intake to 2,300 mg a day. If you have hypertension, you may need to reduce your sodium intake to 1,500 mg a day.  When on the DASH eating plan, aim to eat more fresh fruits and vegetables, whole grains, lean proteins, low-fat dairy, and heart-healthy fats.  Work with your health care provider or diet and nutrition specialist (dietitian) to adjust your eating plan to your individual calorie needs. This information is not intended to replace advice given to you by your health care provider. Make sure you discuss any questions you have with your health care provider. Document Released: 09/29/2011 Document Revised: 10/03/2016 Document Reviewed: 10/03/2016 Elsevier Interactive Patient Education  2018 [place]. Maryem Shuffler M.D.

## 2018-08-13 ENCOUNTER — Ambulatory Visit (INDEPENDENT_AMBULATORY_CARE_PROVIDER_SITE_OTHER): Payer: Self-pay | Admitting: Internal Medicine

## 2018-08-13 ENCOUNTER — Encounter: Payer: Self-pay | Admitting: Internal Medicine

## 2018-08-13 VITALS — BP 150/90 | HR 74 | Temp 98.0°F | Ht 73.0 in | Wt 202.2 lb

## 2018-08-13 DIAGNOSIS — E786 Lipoprotein deficiency: Secondary | ICD-10-CM

## 2018-08-13 DIAGNOSIS — R739 Hyperglycemia, unspecified: Secondary | ICD-10-CM

## 2018-08-13 DIAGNOSIS — Z0000 Encounter for general adult medical examination without abnormal findings: Secondary | ICD-10-CM

## 2018-08-13 DIAGNOSIS — G4700 Insomnia, unspecified: Secondary | ICD-10-CM

## 2018-08-13 DIAGNOSIS — R030 Elevated blood-pressure reading, without diagnosis of hypertension: Secondary | ICD-10-CM

## 2018-08-13 DIAGNOSIS — Z23 Encounter for immunization: Secondary | ICD-10-CM

## 2018-08-13 DIAGNOSIS — Z79899 Other long term (current) drug therapy: Secondary | ICD-10-CM

## 2018-08-13 MED ORDER — ZOLPIDEM TARTRATE 10 MG PO TABS: 10.0000 mg | ORAL_TABLET | Freq: Every evening | ORAL | 2 refills | Status: DC | PRN

## 2018-08-13 NOTE — Patient Instructions (Addendum)
See GI  about fu  Colonoscopy  Dr Fuller Plan.     In 6 mos   Fasting Lab and ov for med check .   120/80   Is best   Life style intervention    If below 140/90   Otherwise contact us .    DASH Eating Plan DASH stands for "Dietary Approaches to Stop Hypertension." The DASH eating plan is a healthy eating plan that has been shown to reduce high blood pressure (hypertension). It may also reduce your risk for type 2 diabetes, heart disease, and stroke. The DASH eating plan may also help with weight loss. What are tips for following this plan? General guidelines  Avoid eating more than 2,300 mg (milligrams) of salt (sodium) a day. If you have hypertension, you may need to reduce your sodium intake to 1,500 mg a day.  Limit alcohol intake to no more than 1 drink a day for nonpregnant women and 2 drinks a day for men. One drink equals 12 oz of beer, 5 oz of wine, or 1 oz of hard liquor.  Work with your health care provider to maintain a healthy body weight or to lose weight. Ask what an ideal weight is for you.  Get at least 30 minutes of exercise that causes your heart to beat faster (aerobic exercise) most days of the week. Activities may include walking, swimming, or biking.  Work with your health care provider or diet and nutrition specialist (dietitian) to adjust your eating plan to your individual calorie needs. Reading food labels  Check food labels for the amount of sodium per serving. Choose foods with less than 5 percent of the Daily Value of sodium. Generally, foods with less than 300 mg of sodium per serving fit into this eating plan.  To find whole grains, look for the word "whole" as the first word in the ingredient list. Shopping  Buy products labeled as "low-sodium" or "no salt added."  Buy fresh foods. Avoid canned foods and premade or frozen meals. Cooking  Avoid adding salt when cooking. Use salt-free seasonings or herbs instead of table salt or sea salt. Check with your  health care provider or pharmacist before using salt substitutes.  Do not fry foods. Cook foods using healthy methods such as baking, boiling, grilling, and broiling instead.  Cook with heart-healthy oils, such as olive, canola, soybean, or sunflower oil. Meal planning   Eat a balanced diet that includes: ? 5 or more servings of fruits and vegetables each day. At each meal, try to fill half of your plate with fruits and vegetables. ? Up to 6-8 servings of whole grains each day. ? Less than 6 oz of lean meat, poultry, or fish each day. A 3-oz serving of meat is about the same size as a deck of cards. One egg equals 1 oz. ? 2 servings of low-fat dairy each day. ? A serving of nuts, seeds, or beans 5 times each week. ? Heart-healthy fats. Healthy fats called Omega-3 fatty acids are found in foods such as flaxseeds and coldwater fish, like sardines, salmon, and mackerel.  Limit how much you eat of the following: ? Canned or prepackaged foods. ? Food that is high in trans fat, such as fried foods. ? Food that is high in saturated fat, such as fatty meat. ? Sweets, desserts, sugary drinks, and other foods with added sugar. ? Full-fat dairy products.  Do not salt foods before eating.  Try to eat at least 2 vegetarian  meals each week.  Eat more home-cooked food and less restaurant, buffet, and fast food.  When eating at a restaurant, ask that your food be prepared with less salt or no salt, if possible. What foods are recommended? The items listed may not be a complete list. Talk with your dietitian about what dietary choices are best for you. Grains Whole-grain or whole-wheat bread. Whole-grain or whole-wheat pasta. Brown rice. [place] Morrow. Bulgur. Whole-grain and low-sodium cereals. Pita bread. Low-fat, low-sodium crackers. Whole-wheat flour tortillas. Vegetables Fresh or frozen vegetables (raw, steamed, roasted, or grilled). Low-sodium or reduced-sodium tomato and vegetable juice.  Low-sodium or reduced-sodium tomato sauce and tomato paste. Low-sodium or reduced-sodium canned vegetables. Fruits All fresh, dried, or frozen fruit. Canned fruit in natural juice (without added sugar). Meat and other protein foods Skinless chicken or [place]. Ground chicken or [place]. Pork with fat trimmed off. Fish and seafood. Egg whites. Dried beans, peas, or lentils. Unsalted nuts, nut butters, and seeds. Unsalted canned beans. Lean cuts of beef with fat trimmed off. Low-sodium, lean deli meat. Dairy Low-fat (1%) or fat-free (skim) milk. Fat-free, low-fat, or reduced-fat cheeses. Nonfat, low-sodium ricotta or cottage cheese. Low-fat or nonfat yogurt. Low-fat, low-sodium cheese. Fats and oils Soft margarine without trans fats. Vegetable oil. Low-fat, reduced-fat, or light mayonnaise and salad dressings (reduced-sodium). Canola, safflower, olive, soybean, and sunflower oils. Avocado. Seasoning and other foods Herbs. Spices. Seasoning mixes without salt. Unsalted popcorn and pretzels. Fat-free sweets. What foods are not recommended? The items listed may not be a complete list. Talk with your dietitian about what dietary choices are best for you. Grains Baked goods made with fat, such as croissants, muffins, or some breads. Dry pasta or rice meal packs. Vegetables Creamed or fried vegetables. Vegetables in a cheese sauce. Regular canned vegetables (not low-sodium or reduced-sodium). Regular canned tomato sauce and paste (not low-sodium or reduced-sodium). Regular tomato and vegetable juice (not low-sodium or reduced-sodium). Angie Fava. Olives. Fruits Canned fruit in a light or heavy syrup. Fried fruit. Fruit in cream or butter sauce. Meat and other protein foods Fatty cuts of meat. Ribs. Fried meat. Berniece Salines. Sausage. Bologna and other processed lunch meats. Salami. Fatback. Hotdogs. Bratwurst. Salted nuts and seeds. Canned beans with added salt. Canned or smoked fish. Whole eggs or egg yolks. Chicken  or [place] with skin. Dairy Whole or 2% milk, cream, and half-and-half. Whole or full-fat cream cheese. Whole-fat or sweetened yogurt. Full-fat cheese. Nondairy creamers. Whipped toppings. Processed cheese and cheese spreads. Fats and oils Butter. Stick margarine. Lard. Shortening. Ghee. Bacon fat. Tropical oils, such as coconut, palm kernel, or palm oil. Seasoning and other foods Salted popcorn and pretzels. Onion salt, garlic salt, seasoned salt, table salt, and sea salt. Worcestershire sauce. Tartar sauce. Barbecue sauce. Teriyaki sauce. Soy sauce, including reduced-sodium. Steak sauce. Canned and packaged gravies. Fish sauce. Oyster sauce. Cocktail sauce. Horseradish that you find on the shelf. Ketchup. Mustard. Meat flavorings and tenderizers. Bouillon cubes. Hot sauce and Tabasco sauce. Premade or packaged marinades. Premade or packaged taco seasonings. Relishes. Regular salad dressings. Where to find more information:  National Heart, Lung, and [place]: https://wilson-eaton.com/  American Heart Association: www.heart.org Summary  The DASH eating plan is a healthy eating plan that has been shown to reduce high blood pressure (hypertension). It may also reduce your risk for type 2 diabetes, heart disease, and stroke.  With the DASH eating plan, you should limit salt (sodium) intake to 2,300 mg a day. If you have hypertension, you may need to  reduce your sodium intake to 1,500 mg a day.  When on the DASH eating plan, aim to eat more fresh fruits and vegetables, whole grains, lean proteins, low-fat dairy, and heart-healthy fats.  Work with your health care provider or diet and nutrition specialist (dietitian) to adjust your eating plan to your individual calorie needs. This information is not intended to replace advice given to you by your health care provider. Make sure you discuss any questions you have with your health care provider. Document Released: 09/29/2011 Document Revised:  10/03/2016 Document Reviewed: 10/03/2016 Elsevier Interactive Patient Education  [employer].

## 2018-08-20 ENCOUNTER — Encounter: Payer: Self-pay | Admitting: Gastroenterology

## 2018-11-06 ENCOUNTER — Other Ambulatory Visit: Payer: Self-pay | Admitting: Internal Medicine

## 2018-11-06 NOTE — Telephone Encounter (Signed)
Last OV:08/13/18 Last filled:08/13/18 Please advise

## 2018-11-06 NOTE — Telephone Encounter (Signed)
Sent in electronically .  

## 2018-11-29 ENCOUNTER — Ambulatory Visit: Payer: Self-pay

## 2018-11-29 MED ORDER — OSELTAMIVIR PHOSPHATE 75 MG PO CAPS: 75.0000 mg | ORAL_CAPSULE | Freq: Two times a day (BID) | ORAL | 0 refills | Status: DC

## 2018-11-29 NOTE — Telephone Encounter (Signed)
I sent in medication   Take as soon as you get it  To be helpful

## 2018-11-29 NOTE — Telephone Encounter (Signed)
Incoming call from Patient who states  that his daughter was diagnosed with the flu .. Dr.  Regis Bill informed Patient that she would order Tamiflu for Patient.  Patient  Reports coughing, , chills, scratchy throat.   Request a Rx for   Tamiflu. @ [facility].      Reason for Disposition . [1] Influenza EXPOSURE (Close Contact) within last 48 hours (2 days) AND [2] exposed person is HIGH RISK (e.g., age > 44 years, pregnant, HIV+, chronic medical condition)  Answer Assessment - Initial Assessment Questions 1. TYPE of EXPOSURE: "How were you exposed?" (e.g., close contact, not a close contact)      2 to 3 days ago 2. DATE of EXPOSURE: "When did the exposure occur?" (e.g., hour, days, weeks)     na 3. PREGNANCY: "Is there any chance you are pregnant?" "When was your last menstrual period?"      4. HIGH RISK for COMPLICATIONS: "Do you have any heart or lung problems? Do you have a weakened immune system?" (e.g., CHF, COPD, asthma, HIV positive, chemotherapy, renal failure, diabetes mellitus, sickle cell anemia)     denies 5. SYMPTOMS: "Do you have any symptoms?" (e.g., cough, fever, sore throat, difficulty breathing).    coughing  chills achy, sratchy throat  Protocols used: INFLUENZA EXPOSURE-A-AH

## 2018-11-29 NOTE — Telephone Encounter (Signed)
Left detailed vm that medication was sent in

## 2018-12-10 ENCOUNTER — Other Ambulatory Visit: Payer: Self-pay | Admitting: Internal Medicine

## 2018-12-10 NOTE — Telephone Encounter (Signed)
Last filled:11/06/2018 Last OV:08/13/18 Upcoming appt:01/14/2019

## 2019-01-04 ENCOUNTER — Other Ambulatory Visit: Payer: Self-pay | Admitting: Internal Medicine

## 2019-01-04 NOTE — Telephone Encounter (Signed)
[name] please advise fill in absence of Dr.Panosh  Last filled:12/11/2018 Last OV:08/13/18 Upcoming appt:01/14/2019

## 2019-01-11 ENCOUNTER — Telehealth: Payer: Self-pay

## 2019-01-11 NOTE — Telephone Encounter (Signed)
Lvm fo rpt to call back to oush appt out for 2 weeks

## 2019-01-14 ENCOUNTER — Ambulatory Visit: Payer: Self-pay | Admitting: Internal Medicine

## 2019-01-28 ENCOUNTER — Telehealth: Payer: Self-pay

## 2019-01-28 NOTE — Telephone Encounter (Signed)
Lvm for pt to call back to schedule an evisit instead

## 2019-01-28 NOTE — Progress Notes (Signed)
Virtual Visit via Video Note  I connected with@ on 01/29/19 at  9:15 AM EDT by a video enabled telemedicine application and verified that I am speaking with the correct person using two identifiers. Location patient: home Location provider:work or home office Persons participating in the virtual visit: patient, provider  WIth national recommendations  regarding COVID 19 pandemic   video visit is advised over in office visit for this patient.  Discussed the limitations of evaluation and management by telemedicine and  availability of in person appointments. agreed to proceed.   HPI:  [name]    Still using Lorrin Mais for sleep  If stops no sleep for 2 days and then can sleep  but has to work doing physical labor  And helps after achy day   No cough cp sob  New sx    BP  Anywhere for 127- 140 usually ok better   Urology fu has been delayed  Cause of covid19 restrictions for now .  No new sx  ROS: See pertinent positives and negatives per HPI. No tobacco ocass etoh. Physical activity work  Scientist, research (physical sciences) projects   Past Medical History:  Diagnosis Date  . Bladder cancer ([place]) 2013   RECURRENT (HX  TURBT  2013)  . Borderline hypertension   . BPH (benign prostatic hyperplasia)   . Cholelithiasis    ASYMPTOMATIC  . Diverticulosis of colon   . History of colon polyps   . Insomnia   . Left inguinal hernia   . Lower urinary tract symptoms (LUTS)   . Red skin    nodule under right under arm x 3 days no drainage   . Wears glasses     Past Surgical History:  Procedure Laterality Date  . COLONOSCOPY  2014  . CYSTOSCOPY W/ RETROGRADES Bilateral 02/19/2014   Procedure: CYSTOSCOPY WITH RETROGRADE PYELOGRAM;  Surgeon: Alexis Frock, MD;  Location: [facility];  Service: Urology;  Laterality: Bilateral;  . CYSTOSCOPY W/ RETROGRADES Bilateral 06/18/2014   Procedure: CYSTOSCOPY WITH RETROGRADE PYELOGRAM;  Surgeon: Alexis Frock, MD;  Location: [facility];  Service: Urology;  Laterality: Bilateral;  . CYSTOSCOPY WITH RETROGRADE PYELOGRAM, URETEROSCOPY AND STENT PLACEMENT  10/03/2012   Procedure: CYSTOSCOPY WITH RETROGRADE PYELOGRAM, URETEROSCOPY AND STENT PLACEMENT;  Surgeon: Alexis Frock, MD;  Location: [facility];  Service: Urology;  Laterality: Left;    . HERNIA REPAIR  as child   right inguinal  . LAPAROSCOPIC CHOLECYSTECTOMY SINGLE SITE WITH INTRAOPERATIVE CHOLANGIOGRAM N/A 08/20/2015   Procedure: LAPAROSCOPIC CHOLECYSTECTOMY SINGLE SITE WITH INTRAOPERATIVE CHOLANGIOGRAM;  Surgeon: Michael Boston, MD;  Location: [facility];  Service: General;  Laterality: N/A;  . TONSILLECTOMY  as child  . TRANSURETHRAL RESECTION OF BLADDER TUMOR  10/03/2012   Procedure: TRANSURETHRAL RESECTION OF BLADDER TUMOR (TURBT);  Surgeon: Alexis Frock, MD;  Location: [facility];  Service: Urology;  Laterality: N/A;  . TRANSURETHRAL RESECTION OF BLADDER TUMOR WITH GYRUS (TURBT-GYRUS) N/A 02/19/2014   Procedure: TRANSURETHRAL RESECTION OF BLADDER TUMOR WITH GYRUS (TURBT-GYRUS);  Surgeon: Alexis Frock, MD;  Location: [facility];  Service: Urology;  Laterality: N/A;  . TRANSURETHRAL RESECTION OF BLADDER TUMOR WITH GYRUS (TURBT-GYRUS) N/A 06/18/2014   Procedure: TRANSURETHRAL RESECTION OF BLADDER TUMOR WITH GYRUS (TURBT-GYRUS) WITH MITOMYCIN INSTILLATION;  Surgeon: Alexis Frock, MD;  Location: [facility];  Service: Urology;  Laterality: N/A;    Family History  Problem Relation Age of Onset  . Lung cancer Mother   . Colon cancer  Unknown        fathers side   . ADD / ADHD Son     SOCIAL HX:  Divorced works Games developer   No tobacco ocass etoh   Current Outpatient Medications:  .  EPINEPHrine (EPIPEN) 0.3 mg/0.3 mL DEVI, Inject 0.3 mLs (0.3 mg total) into the muscle once., Disp: 1 Device, Rfl: 0 .  oseltamivir (TAMIFLU) 75 MG capsule, Take 1 capsule (75 mg total) by mouth 2 (two) times daily., Disp:  10 capsule, Rfl: 0 .  zolpidem (AMBIEN) 10 MG tablet, Take 1 tablet (10 mg total) by mouth at bedtime as needed. for sleep, Disp: 30 tablet, Rfl: 2  EXAM:  VITALS per patient if applicable:  GENERAL: alert, oriented, appears well and in no acute distress  HEENT: atraumatic, conjunttiva clear, no obvious abnormalities on inspection of external nose and ears  NECK: normal movements of the head and neck  LUNGS: on inspection no signs of respiratory distress, breathing rate appears normal, no obvious gross SOB, gasping or wheezing  CV: no obvious cyanosis  MS: moves all visible extremities without noticeable abnormality  PSYCH/NEURO: pleasant and cooperative, no obvious depression or anxiety, speech and thought processing grossly intact Lab Results  Component Value Date   WBC 6.4 11/07/2017   HGB 15.1 11/07/2017   HCT 43.8 11/07/2017   PLT 246.0 11/07/2017   GLUCOSE 105 (H) 11/07/2017   CHOL 170 07/14/2017   TRIG 89.0 07/14/2017   HDL 37.70 (L) 07/14/2017   LDLDIRECT 120.3 07/21/2008   LDLCALC 115 (H) 07/14/2017   ALT 19 11/07/2017   AST 15 11/07/2017   NA 139 11/07/2017   K 4.0 11/07/2017   CL 102 11/07/2017   CREATININE 0.84 11/07/2017   BUN 15 11/07/2017   CO2 30 11/07/2017   TSH 3.80 07/01/2016   PSA 1.80 07/14/2017   HGBA1C 5.1 11/20/2012    ASSESSMENT AND PLAN:  Discussed the following assessment and plan:  Insomnia, unspecified type - Plan: Lipid panel, Basic metabolic panel, Hemoglobin A1c  Medication management - Plan: Lipid panel, Basic metabolic panel, Hemoglobin A1c  Low HDL (under 40) - Plan: Lipid panel, Basic metabolic panel, Hemoglobin A1c  Hyperglycemia - Plan: Lipid panel, Basic metabolic panel, Hemoglobin A1c  Borderline high blood pressure  History of bladder cancer  bp  Borderline at times  But at goal some Med  Last lab 1 2019   Future labs  Lipids and blood sugar   Is self pay  At this time and ok with this  Plan lab and then cpx in  fall ( 6 mos)   disc  Risk benefit of medication discussed.  and can try off some days  5 mg others   Ok to continue at this time and will send in refill today  Expectant management and discussion of plan and treatment with patient with opportunity to ask questions and all were answered. The patient agreed with the plan and demonstrated an understanding of the instructions.   The patient was advised to call back  if having concerns    In interim    Shanon Ace, MD

## 2019-01-29 ENCOUNTER — Encounter: Payer: Self-pay | Admitting: Internal Medicine

## 2019-01-29 ENCOUNTER — Ambulatory Visit (INDEPENDENT_AMBULATORY_CARE_PROVIDER_SITE_OTHER): Payer: Self-pay | Admitting: Internal Medicine

## 2019-01-29 ENCOUNTER — Other Ambulatory Visit: Payer: Self-pay

## 2019-01-29 DIAGNOSIS — R739 Hyperglycemia, unspecified: Secondary | ICD-10-CM

## 2019-01-29 DIAGNOSIS — R030 Elevated blood-pressure reading, without diagnosis of hypertension: Secondary | ICD-10-CM

## 2019-01-29 DIAGNOSIS — E786 Lipoprotein deficiency: Secondary | ICD-10-CM

## 2019-01-29 DIAGNOSIS — Z8551 Personal history of malignant neoplasm of bladder: Secondary | ICD-10-CM

## 2019-01-29 DIAGNOSIS — G4700 Insomnia, unspecified: Secondary | ICD-10-CM

## 2019-01-29 DIAGNOSIS — Z79899 Other long term (current) drug therapy: Secondary | ICD-10-CM

## 2019-01-29 MED ORDER — ZOLPIDEM TARTRATE 10 MG PO TABS: 10.0000 mg | ORAL_TABLET | Freq: Every evening | ORAL | 2 refills | Status: DC | PRN

## 2019-05-02 ENCOUNTER — Other Ambulatory Visit: Payer: Self-pay | Admitting: Internal Medicine

## 2019-05-02 NOTE — Telephone Encounter (Signed)
Please advise 

## 2019-06-04 ENCOUNTER — Other Ambulatory Visit: Payer: Self-pay | Admitting: Internal Medicine

## 2019-06-04 NOTE — Telephone Encounter (Signed)
Pt requesting refill on Zolpidem. Last seen 01/29/2019. Last Rx 05/02/2019, # 30, refill 0.

## 2019-07-06 ENCOUNTER — Other Ambulatory Visit: Payer: Self-pay | Admitting: Internal Medicine

## 2019-07-08 NOTE — Telephone Encounter (Signed)
Last ov:01/29/2019 Last filled:06/04/2019

## 2019-08-07 ENCOUNTER — Other Ambulatory Visit: Payer: Self-pay | Admitting: Internal Medicine

## 2019-08-07 NOTE — Telephone Encounter (Signed)
Last filled 07/09/2019 Last OV 01/29/2019  Ok to fill?

## 2019-08-07 NOTE — Telephone Encounter (Signed)
He needs to make cpx appt   Before   Next refill please help arrange for this   I sent a Refill x 1 today

## 2019-09-04 ENCOUNTER — Other Ambulatory Visit: Payer: Self-pay | Admitting: Internal Medicine

## 2019-09-04 NOTE — Telephone Encounter (Signed)
Requested medication (s) are due for refill today: yes  Requested medication (s) are on the active medication list: yes  Last refill:  08/07/2019  Future visit scheduled: yes  Notes to clinic:  Refill cannot be delegated    Requested Prescriptions  Pending Prescriptions Disp Refills   zolpidem (AMBIEN) 10 MG tablet 30 tablet 0    Sig: Take 1 tablet (10 mg total) by mouth at bedtime as needed. for sleep     Not Delegated - Psychiatry:  Anxiolytics/Hypnotics Failed - 09/04/2019  3:29 PM      Failed - This refill cannot be delegated      Failed - Urine Drug Screen completed in last 360 days.      Failed - Valid encounter within last 6 months    Recent Outpatient Visits          7 months ago Insomnia, unspecified type   [facility] at [facility], Standley Brooking, MD   1 year ago Visit for preventive health examination   [place] at [facility], Standley Brooking, MD   1 year ago Right upper quadrant abdominal pain   [place] at [facility], Standley Brooking, MD   2 years ago Visit for preventive health examination   [place] at [facility], Standley Brooking, MD   3 years ago Visit for preventive health examination   [place] at [facility], Standley Brooking, MD      Future Appointments            In 1 month Panosh, Standley Brooking, MD [place] at [place], [facility]

## 2019-09-04 NOTE — Telephone Encounter (Signed)
Patient has already scheduled an appt. For Jan. 2021.  Patient is requesting a refill.

## 2019-09-04 NOTE — Telephone Encounter (Signed)
Request has already been sent to dr.Panosh

## 2019-09-04 NOTE — Telephone Encounter (Signed)
Lastov:01/29/2019 Last filled:08/07/2019 Upcoming visit :10/30/2019

## 2019-09-04 NOTE — Telephone Encounter (Signed)
lvm for pt to call back to schedule a physical to be able to fill medication

## 2019-09-04 NOTE — Telephone Encounter (Signed)
Medication Refill - Medication: zolpidem (AMBIEN) 10 MG tablet    Preferred Pharmacy (with phone number or street name):  [place], [place] [phone] (Phone) [phone] (Fax)     Agent: Please be advised that RX refills may take up to 3 business days. We ask that you follow-up with your pharmacy.

## 2019-09-04 NOTE — Telephone Encounter (Signed)
Please see other request.

## 2019-09-24 ENCOUNTER — Other Ambulatory Visit: Payer: Self-pay

## 2019-09-24 DIAGNOSIS — Z20822 Contact with and (suspected) exposure to covid-19: Secondary | ICD-10-CM

## 2019-09-25 LAB — NOVEL CORONAVIRUS, NAA: SARS-CoV-2, NAA: NOT DETECTED

## 2019-10-04 ENCOUNTER — Other Ambulatory Visit: Payer: Self-pay | Admitting: Internal Medicine

## 2019-10-04 NOTE — Telephone Encounter (Signed)
Last ov:01/29/2019 Last filled:09/05/2019 Upcoming :10/30/2019 Please advise in absence of Dr.Panosh

## 2019-10-30 ENCOUNTER — Ambulatory Visit (INDEPENDENT_AMBULATORY_CARE_PROVIDER_SITE_OTHER): Payer: Self-pay | Admitting: Internal Medicine

## 2019-10-30 ENCOUNTER — Encounter: Payer: Self-pay | Admitting: Internal Medicine

## 2019-10-30 ENCOUNTER — Other Ambulatory Visit: Payer: Self-pay

## 2019-10-30 VITALS — BP 138/62 | HR 68 | Temp 98.0°F | Ht 73.0 in | Wt 225.0 lb

## 2019-10-30 DIAGNOSIS — Z79899 Other long term (current) drug therapy: Secondary | ICD-10-CM

## 2019-10-30 DIAGNOSIS — R1011 Right upper quadrant pain: Secondary | ICD-10-CM

## 2019-10-30 DIAGNOSIS — R739 Hyperglycemia, unspecified: Secondary | ICD-10-CM

## 2019-10-30 DIAGNOSIS — G4700 Insomnia, unspecified: Secondary | ICD-10-CM

## 2019-10-30 DIAGNOSIS — Z0000 Encounter for general adult medical examination without abnormal findings: Secondary | ICD-10-CM

## 2019-10-30 DIAGNOSIS — Z23 Encounter for immunization: Secondary | ICD-10-CM

## 2019-10-30 DIAGNOSIS — E786 Lipoprotein deficiency: Secondary | ICD-10-CM

## 2019-10-30 NOTE — Patient Instructions (Addendum)
Losing weight in healthy manner .    Lab today .    Consider seeing  Allergy if  Edema continuing .   Get colon cancer screening     Preventive Care 60-60 Years Old, Male Preventive care refers to lifestyle choices and visits with your health care provider that can promote health and wellness. This includes:  A yearly physical exam. This is also called an annual well check.  Regular dental and eye exams.  Immunizations.  Screening for certain conditions.  Healthy lifestyle choices, such as eating a healthy diet, getting regular exercise, not using drugs or products that contain nicotine and tobacco, and limiting alcohol use. What can I expect for my preventive care visit? Physical exam Your health care provider will check:  Height and weight. These may be used to calculate body mass index (BMI), which is a measurement that tells if you are at a healthy weight.  Heart rate and blood pressure.  Your skin for abnormal spots. Counseling Your health care provider may ask you questions about:  Alcohol, tobacco, and drug use.  Emotional well-being.  Home and relationship well-being.  Sexual activity.  Eating habits.  Work and work Statistician. What immunizations do I need?  Influenza (flu) vaccine  This is recommended every year. Tetanus, diphtheria, and pertussis (Tdap) vaccine  You may need a Td booster every 10 years. Varicella (chickenpox) vaccine  You may need this vaccine if you have not already been vaccinated. Zoster (shingles) vaccine  You may need this after age 60. Measles, mumps, and rubella (MMR) vaccine  You may need at least one dose of MMR if you were born in 1957 or later. You may also need a second dose. Pneumococcal conjugate (PCV13) vaccine  You may need this if you have certain conditions and were not previously vaccinated. Pneumococcal polysaccharide (PPSV23) vaccine  You may need one or two doses if you smoke cigarettes or if you have  certain conditions. Meningococcal conjugate (MenACWY) vaccine  You may need this if you have certain conditions. Hepatitis A vaccine  You may need this if you have certain conditions or if you travel or work in places where you may be exposed to hepatitis A. Hepatitis B vaccine  You may need this if you have certain conditions or if you travel or work in places where you may be exposed to hepatitis B. Haemophilus influenzae type b (Hib) vaccine  You may need this if you have certain risk factors. Human papillomavirus (HPV) vaccine  If recommended by your health care provider, you may need three doses over 6 months. You may receive vaccines as individual doses or as more than one vaccine together in one shot (combination vaccines). Talk with your health care provider about the risks and benefits of combination vaccines. What tests do I need? Blood tests  Lipid and cholesterol levels. These may be checked every 5 years, or more frequently if you are over 15 years old.  Hepatitis C test.  Hepatitis B test. Screening  Lung cancer screening. You may have this screening every year starting at age 60 if you have a 30-pack-year history of smoking and currently smoke or have quit within the past 15 years.  Prostate cancer screening. Recommendations will vary depending on your family history and other risks.  Colorectal cancer screening. All adults should have this screening starting at age 60 and continuing until age 60. Your health care provider may recommend screening at age 60 if you are at increased risk.  You will have tests every 1-10 years, depending on your results and the type of screening test.  Diabetes screening. This is done by checking your blood sugar (glucose) after you have not eaten for a while (fasting). You may have this done every 1-3 years.  Sexually transmitted disease (STD) testing. Follow these instructions at home: Eating and drinking  Eat a diet that includes  fresh fruits and vegetables, whole grains, lean protein, and low-fat dairy products.  Take vitamin and mineral supplements as recommended by your health care provider.  Do not drink alcohol if your health care provider tells you not to drink.  If you drink alcohol: ? Limit how much you have to 0-2 drinks a day. ? Be aware of how much alcohol is in your drink. In the [place] one drink equals one 12 oz bottle of beer (355 mL), one 5 oz glass of wine (148 mL), or one 1 oz glass of hard liquor (44 mL). Lifestyle  Take daily care of your teeth and gums.  Stay active. Exercise for at least 30 minutes on 5 or more days each week.  Do not use any products that contain nicotine or tobacco, such as cigarettes, e-cigarettes, and chewing tobacco. If you need help quitting, ask your health care provider.  If you are sexually active, practice safe sex. Use a condom or other form of protection to prevent STIs (sexually transmitted infections).  Talk with your health care provider about taking a low-dose aspirin every day starting at age 60. What's next?  Go to your health care provider once a year for a well check visit.  Ask your health care provider how often you should have your eyes and teeth checked.  Stay up to date on all vaccines. This information is not intended to replace advice given to you by your health care provider. Make sure you discuss any questions you have with your health care provider. Document Revised: 10/04/2018 Document Reviewed: 10/04/2018 Elsevier Patient Education  2020 [employer].

## 2019-10-30 NOTE — Progress Notes (Signed)
This visit occurred during the SARS-CoV-2 public health emergency.  Safety protocols were in place, including screening questions prior to the visit, additional usage of staff PPE, and extensive cleaning of exam room while observing appropriate contact time as indicated for disinfecting solutions.      Chief Complaint  Patient presents with  . Annual Exam    pt is concerned about pain on his right side and states he had his lip swell up     HPI: Patient  [name]  59 y.o. comes in today for [place] visit   Allergist  In pasty dx dust allergy    Hx fo allergy evaluation  After ice cream. Lips swelling  Then had gone   Down without medication. DPH  Then  Had large tongue no resp of gi distress and no recurrence no trigger last  Event over a year ago  Had epi pen and dph at home.  Using [place] not every night but as needed  Urology    6- 12 mos and longer  All stable   Has gained weight over covid and holidays  Working on weight loss  Still gts side abd pain at times  > shingrix vaccine?  Health Maintenance  Topic Date Due  . COLONOSCOPY  08/22/2018  . TETANUS/TDAP  08/04/2020  . INFLUENZA VACCINE  Completed  . Hepatitis C Screening  Completed  . HIV Screening  Completed   Health Maintenance Review LIFESTYLE:  Exercise: need more    Tobacco/ETS: no Alcohol:   Few off an on stopped  Weight  Sugar beverages:small coke  ocass  Sleep: not every night    Drug use: no HH of  1-2  Work: 40   ROS:  GEN/ HEENT: No fever, significant weight changes sweats headaches vision problems hearing changes, CV/ PULM; No chest pain shortness of breath cough, syncope,edema  change in exercise tolerance. GI /GU: No adominal pain, vomiting, change in bowel habits. No blood in the stool. No significant GU symptoms. SKIN/HEME: ,no acute skin rashes suspicious lesions or bleeding. No lymphadenopathy, nodules, masses.  NEURO/ PSYCH:  No neurologic signs such as weakness  numbness. No depression anxiety. IMM/ Allergy: No unusual infections.  Allergy .   REST of 12 system review negative except as per HPI   Past Medical History:  Diagnosis Date  . Bladder cancer ([place]) 2013   RECURRENT (HX  TURBT  2013)  . Borderline hypertension   . BPH (benign prostatic hyperplasia)   . Cholelithiasis    ASYMPTOMATIC  . Diverticulosis of colon   . History of colon polyps   . Insomnia   . Left inguinal hernia   . Lower urinary tract symptoms (LUTS)   . Red skin    nodule under right under arm x 3 days no drainage   . Wears glasses     Past Surgical History:  Procedure Laterality Date  . COLONOSCOPY  2014  . CYSTOSCOPY W/ RETROGRADES Bilateral 02/19/2014   Procedure: CYSTOSCOPY WITH RETROGRADE PYELOGRAM;  Surgeon: Alexis Frock, MD;  Location: [facility];  Service: Urology;  Laterality: Bilateral;  . CYSTOSCOPY W/ RETROGRADES Bilateral 06/18/2014   Procedure: CYSTOSCOPY WITH RETROGRADE PYELOGRAM;  Surgeon: Alexis Frock, MD;  Location: [facility];  Service: Urology;  Laterality: Bilateral;  . CYSTOSCOPY WITH RETROGRADE PYELOGRAM, URETEROSCOPY AND STENT PLACEMENT  10/03/2012   Procedure: CYSTOSCOPY WITH RETROGRADE PYELOGRAM, URETEROSCOPY AND STENT PLACEMENT;  Surgeon: Alexis Frock, MD;  Location[facility];  Service: Urology;  Laterality: Left;    . HERNIA REPAIR  as child   right inguinal  . LAPAROSCOPIC CHOLECYSTECTOMY SINGLE SITE WITH INTRAOPERATIVE CHOLANGIOGRAM N/A 08/20/2015   Procedure: LAPAROSCOPIC CHOLECYSTECTOMY SINGLE SITE WITH INTRAOPERATIVE CHOLANGIOGRAM;  Surgeon: Michael Boston, MD;  Location: [facility];  Service: General;  Laterality: N/A;  . TONSILLECTOMY  as child  . TRANSURETHRAL RESECTION OF BLADDER TUMOR  10/03/2012   Procedure: TRANSURETHRAL RESECTION OF BLADDER TUMOR (TURBT);  Surgeon: Alexis Frock, MD;  Location: [facility];  Service: Urology;  Laterality: N/A;  . TRANSURETHRAL  RESECTION OF BLADDER TUMOR WITH GYRUS (TURBT-GYRUS) N/A 02/19/2014   Procedure: TRANSURETHRAL RESECTION OF BLADDER TUMOR WITH GYRUS (TURBT-GYRUS);  Surgeon: Alexis Frock, MD;  Location: [facility];  Service: Urology;  Laterality: N/A;  . TRANSURETHRAL RESECTION OF BLADDER TUMOR WITH GYRUS (TURBT-GYRUS) N/A 06/18/2014   Procedure: TRANSURETHRAL RESECTION OF BLADDER TUMOR WITH GYRUS (TURBT-GYRUS) WITH MITOMYCIN INSTILLATION;  Surgeon: Alexis Frock, MD;  Location: [facility];  Service: Urology;  Laterality: N/A;    Family History  Problem Relation Age of Onset  . Lung cancer Mother   . Colon cancer Unknown        fathers side   . ADD / ADHD Son       Outpatient Medications Prior to Visit  Medication Sig Dispense Refill  . EPINEPHrine (EPIPEN) 0.3 mg/0.3 mL DEVI Inject 0.3 mLs (0.3 mg total) into the muscle once. 1 Device 0  . zolpidem (AMBIEN) 10 MG tablet TAKE 1 TABLET AT BEDTIME AS NEEDED FOR SLEEP. 30 tablet 0  . oseltamivir (TAMIFLU) 75 MG capsule Take 1 capsule (75 mg total) by mouth 2 (two) times daily. (Patient not taking: Reported on 10/30/2019) 10 capsule 0   No facility-administered medications prior to visit.     EXAM:  BP 138/62 (BP Location: Right Arm, Patient Position: Sitting, Cuff Size: Normal)   Pulse 68   Temp 98 F (36.7 C) (Temporal)   Ht '6\' 1"'  (1.854 m)   Wt 225 lb (102.1 kg)   SpO2 98%   BMI 29.69 kg/m   Body mass index is 29.69 kg/m. Wt Readings from Last 3 Encounters:  10/30/19 225 lb (102.1 kg)  08/13/18 202 lb 3.2 oz (91.7 kg)  11/07/17 208 lb 9.6 oz (94.6 kg)    Physical Exam: Vital signs reviewed NOB:SJGG is a well-developed well-nourished alert cooperative    who appearsr stated age in no acute distress.  HEENT: normocephalic atraumatic , Eyes: PERRL EOM's full, conjunctiva clear, Nares: paten,t no deformity discharge or tenderness., Ears: no deformity EAC's clear TMs with normal landmarks. Mouth: masked    NECK: supple without masses, thyromegaly or bruits. CHEST/PULM:  Clear to auscultation and percussion breath sounds equal no wheeze , rales or rhonchi. No chest wall deformities or tenderness. CV: PMI is nondisplaced, S1 S2 no gallops, murmurs, rubs. Peripheral pulses are full without delay.No JVD .  ABDOMEN: Bowel sounds normal nontender  No guard or rebound, no hepato splenomegal no CVA tenderness. Mild disc ruq no obv mass  Extremtities:  No clubbing cyanosis or edema, no acute joint swelling or redness no focal atrophy NEURO:  Oriented x3, cranial nerves 3-12 appear to be intact, no obvious focal weakness,gait within normal limits no abnormal reflexes or asymmetrical SKIN: No acute rashes normal turgor, color, no bruising or petechiae. PSYCH: Oriented, good eye contact, no obvious depression anxiety, cognition and judgment appear normal. LN: no cervical axillary inguinal adenopathy  Lab Results  Component Value Date   WBC 6.4 11/07/2017   HGB 15.1 11/07/2017   HCT 43.8 11/07/2017   PLT 246.0 11/07/2017   GLUCOSE 105 (H) 11/07/2017   CHOL 170 07/14/2017   TRIG 89.0 07/14/2017   HDL 37.70 (L) 07/14/2017   LDLDIRECT 120.3 07/21/2008   LDLCALC 115 (H) 07/14/2017   ALT 19 11/07/2017   AST 15 11/07/2017   NA 139 11/07/2017   K 4.0 11/07/2017   CL 102 11/07/2017   CREATININE 0.84 11/07/2017   BUN 15 11/07/2017   CO2 30 11/07/2017   TSH 3.80 07/01/2016   PSA 1.80 07/14/2017   HGBA1C 5.1 11/20/2012    BP Readings from Last 3 Encounters:  10/30/19 138/62  08/13/18 (!) 150/90  11/07/17 (!) 142/98   Not fasting had egg and avocado for  Lunch   Wt Readings from Last 3 Encounters:  10/30/19 225 lb (102.1 kg)  08/13/18 202 lb 3.2 oz (91.7 kg)  11/07/17 208 lb 9.6 oz (94.6 kg)    Lab plan   ASSESSMENT AND PLAN:  Discussed the following assessment and plan:    ICD-10-CM   1. Visit for preventive health examination  I94.85 Basic metabolic panel    CBC with Differential     Hepatic function panel    Lipid panel    PSA    Hemoglobin A1c  2. Need for immunization against influenza  Z23 Flu Vaccine QUAD 36+ mos IM  3. Hyperglycemia  I62.7 Basic metabolic panel    CBC with Differential    Hepatic function panel    Lipid panel    PSA    Hemoglobin A1c  4. Low HDL (under 40)  O35.0 Basic metabolic panel    CBC with Differential    Hepatic function panel    Lipid panel    PSA    Hemoglobin A1c  5. Medication management  K93.818 Basic metabolic panel    CBC with Differential    Hepatic function panel    Lipid panel    PSA    Hemoglobin A1c  6. Insomnia, unspecified type  E99.37 Basic metabolic panel    CBC with Differential    Hepatic function panel    Lipid panel    PSA    Hemoglobin A1c  7. RUQ abdominal pain  J69.67 Basic metabolic panel    CBC with Differential    Hepatic function panel    Lipid panel    PSA    Hemoglobin A1c   Get the colon cancer screening and consider seeing allergist again .  Lab nf screening   Cost  shingrix 270+ 48   And discount self pay   Patient Care Team: Dovey Fatzinger, Standley Brooking, MD as PCP - General Alexis Frock, MD as Attending Physician (Urology) Patient Instructions  Losing weight in healthy manner .    Lab today .    Consider seeing  Allergy if  Edema continuing .   Get colon cancer screening     Preventive Care 52-41 Years Old, Male Preventive care refers to lifestyle choices and visits with your health care provider that can promote health and wellness. This includes:  A yearly physical exam. This is also called an annual well check.  Regular dental and eye exams.  Immunizations.  Screening for certain conditions.  Healthy lifestyle choices, such as eating a healthy diet, getting regular exercise, not using drugs or products that contain nicotine and tobacco, and limiting alcohol use. What can I expect for my preventive  care visit? Physical exam Your health care provider will check:  Height and  weight. These may be used to calculate body mass index (BMI), which is a measurement that tells if you are at a healthy weight.  Heart rate and blood pressure.  Your skin for abnormal spots. Counseling Your health care provider may ask you questions about:  Alcohol, tobacco, and drug use.  Emotional well-being.  Home and relationship well-being.  Sexual activity.  Eating habits.  Work and work Statistician. What immunizations do I need?  Influenza (flu) vaccine  This is recommended every year. Tetanus, diphtheria, and pertussis (Tdap) vaccine  You may need a Td booster every 10 years. Varicella (chickenpox) vaccine  You may need this vaccine if you have not already been vaccinated. Zoster (shingles) vaccine  You may need this after age 60. Measles, mumps, and rubella (MMR) vaccine  You may need at least one dose of MMR if you were born in 1957 or later. You may also need a second dose. Pneumococcal conjugate (PCV13) vaccine  You may need this if you have certain conditions and were not previously vaccinated. Pneumococcal polysaccharide (PPSV23) vaccine  You may need one or two doses if you smoke cigarettes or if you have certain conditions. Meningococcal conjugate (MenACWY) vaccine  You may need this if you have certain conditions. Hepatitis A vaccine  You may need this if you have certain conditions or if you travel or work in places where you may be exposed to hepatitis A. Hepatitis B vaccine  You may need this if you have certain conditions or if you travel or work in places where you may be exposed to hepatitis B. Haemophilus influenzae type b (Hib) vaccine  You may need this if you have certain risk factors. Human papillomavirus (HPV) vaccine  If recommended by your health care provider, you may need three doses over 6 months. You may receive vaccines as individual doses or as more than one vaccine together in one shot (combination vaccines). Talk with  your health care provider about the risks and benefits of combination vaccines. What tests do I need? Blood tests  Lipid and cholesterol levels. These may be checked every 5 years, or more frequently if you are over 82 years old.  Hepatitis C test.  Hepatitis B test. Screening  Lung cancer screening. You may have this screening every year starting at age 60 if you have a 30-pack-year history of smoking and currently smoke or have quit within the past 15 years.  Prostate cancer screening. Recommendations will vary depending on your family history and other risks.  Colorectal cancer screening. All adults should have this screening starting at age 31 and continuing until age 30. Your health care provider may recommend screening at age 52 if you are at increased risk. You will have tests every 1-10 years, depending on your results and the type of screening test.  Diabetes screening. This is done by checking your blood sugar (glucose) after you have not eaten for a while (fasting). You may have this done every 1-3 years.  Sexually transmitted disease (STD) testing. Follow these instructions at home: Eating and drinking  Eat a diet that includes fresh fruits and vegetables, whole grains, lean protein, and low-fat dairy products.  Take vitamin and mineral supplements as recommended by your health care provider.  Do not drink alcohol if your health care provider tells you not to drink.  If you drink alcohol: ? Limit how much you have to 0-2 drinks  a day. ? Be aware of how much alcohol is in your drink. In the [place] one drink equals one 12 oz bottle of beer (355 mL), one 5 oz glass of wine (148 mL), or one 1 oz glass of hard liquor (44 mL). Lifestyle  Take daily care of your teeth and gums.  Stay active. Exercise for at least 30 minutes on 5 or more days each week.  Do not use any products that contain nicotine or tobacco, such as cigarettes, e-cigarettes, and chewing tobacco. If you  need help quitting, ask your health care provider.  If you are sexually active, practice safe sex. Use a condom or other form of protection to prevent STIs (sexually transmitted infections).  Talk with your health care provider about taking a low-dose aspirin every day starting at age 34. What's next?  Go to your health care provider once a year for a well check visit.  Ask your health care provider how often you should have your eyes and teeth checked.  Stay up to date on all vaccines. This information is not intended to replace advice given to you by your health care provider. Make sure you discuss any questions you have with your health care provider. Document Revised: 10/04/2018 Document Reviewed: 10/04/2018 Elsevier Patient Education  2020 [employer] Gabrielsen M.D.

## 2019-10-31 LAB — BASIC METABOLIC PANEL
BUN: 20 mg/dL (ref 6–23)
CO2: 29 mEq/L (ref 19–32)
Calcium: 10.2 mg/dL (ref 8.4–10.5)
Chloride: 102 mEq/L (ref 96–112)
Creatinine, Ser: 1 mg/dL (ref 0.40–1.50)
GFR: 76.41 mL/min (ref >60.00)
Glucose, Bld: 95 mg/dL (ref 70–99)
Potassium: 4 mEq/L (ref 3.5–5.1)
Sodium: 139 mEq/L (ref 135–145)

## 2019-10-31 LAB — HEPATIC FUNCTION PANEL
ALT: 26 U/L (ref 0–53)
AST: 20 U/L (ref 0–37)
Albumin: 5 g/dL (ref 3.5–5.2)
Alkaline Phosphatase: 67 U/L (ref 39–117)
Bilirubin, Direct: 0.1 mg/dL (ref 0.0–0.3)
Total Bilirubin: 0.8 mg/dL (ref 0.2–1.2)
Total Protein: 7.8 g/dL (ref 6.0–8.3)

## 2019-10-31 LAB — CBC WITH DIFFERENTIAL/PLATELET
Basophils Absolute: 0 10*3/uL (ref 0.0–0.1)
Basophils Relative: 0.6 % (ref 0.0–3.0)
Eosinophils Absolute: 0.3 10*3/uL (ref 0.0–0.7)
Eosinophils Relative: 3.5 % (ref 0.0–5.0)
HCT: 46.7 % (ref 39.0–52.0)
Hemoglobin: 16.2 g/dL (ref 13.0–17.0)
Lymphocytes Relative: 35.6 % (ref 12.0–46.0)
Lymphs Abs: 2.8 10*3/uL (ref 0.7–4.0)
MCHC: 34.7 g/dL (ref 30.0–36.0)
MCV: 92.9 fl (ref 78.0–100.0)
Monocytes Absolute: 0.5 10*3/uL (ref 0.1–1.0)
Monocytes Relative: 6.1 % (ref 3.0–12.0)
Neutro Abs: 4.2 10*3/uL (ref 1.4–7.7)
Neutrophils Relative %: 54.2 % (ref 43.0–77.0)
Platelets: 272 10*3/uL (ref 150.0–400.0)
RBC: 5.03 Mil/uL (ref 4.22–5.81)
RDW: 13.4 % (ref 11.5–15.5)
WBC: 7.8 10*3/uL (ref 4.0–10.5)

## 2019-10-31 LAB — LIPID PANEL
Cholesterol: 185 mg/dL (ref 0–200)
HDL: 44.4 mg/dL (ref >39.00)
LDL Cholesterol: 112 mg/dL — ABNORMAL HIGH (ref 0–99)
NonHDL: 140.37
Total CHOL/HDL Ratio: 4
Triglycerides: 142 mg/dL (ref 0.0–149.0)
VLDL: 28.4 mg/dL (ref 0.0–40.0)

## 2019-10-31 LAB — HEMOGLOBIN A1C: Hgb A1c MFr Bld: 5.2 % (ref 4.6–6.5)

## 2019-10-31 LAB — PSA: PSA: 2.58 ng/mL (ref 0.10–4.00)

## 2019-11-07 ENCOUNTER — Other Ambulatory Visit: Payer: Self-pay | Admitting: Family Medicine

## 2019-11-07 NOTE — Telephone Encounter (Signed)
Last filled:10/04/2019 Last ov:10/30/2019

## 2019-12-08 ENCOUNTER — Other Ambulatory Visit: Payer: Self-pay | Admitting: Internal Medicine

## 2019-12-09 NOTE — Telephone Encounter (Signed)
Last Rx given on 11/08/19 with no ref

## 2020-03-02 ENCOUNTER — Other Ambulatory Visit: Payer: Self-pay | Admitting: Internal Medicine

## 2020-03-03 NOTE — Telephone Encounter (Signed)
Last OV 10/30/2019  Last filled 12/09/2019, # 30 with 2 refills

## 2020-05-31 ENCOUNTER — Other Ambulatory Visit: Payer: Self-pay | Admitting: Internal Medicine

## 2020-06-01 NOTE — Telephone Encounter (Signed)
Last OV 10/30/2019  Last filled 03/03/2020, # 30 with 2 refills

## 2020-07-03 ENCOUNTER — Other Ambulatory Visit: Payer: Self-pay | Admitting: Internal Medicine

## 2020-07-03 NOTE — Telephone Encounter (Signed)
Last OV 10/30/2019  Last filled 06/02/2020, # 30 with 0 refills

## 2020-08-06 ENCOUNTER — Other Ambulatory Visit: Payer: Self-pay | Admitting: Internal Medicine

## 2020-09-09 ENCOUNTER — Other Ambulatory Visit: Payer: Self-pay | Admitting: Internal Medicine

## 2020-09-09 NOTE — Telephone Encounter (Signed)
Last OV: 01/21 Last filled: 08/10/20

## 2020-10-09 ENCOUNTER — Other Ambulatory Visit: Payer: Self-pay | Admitting: Internal Medicine

## 2020-10-09 NOTE — Telephone Encounter (Signed)
Last filled 09/09/20, can you advise in Dr. Velora Mediate absence?

## 2020-11-05 ENCOUNTER — Other Ambulatory Visit: Payer: Self-pay | Admitting: Internal Medicine

## 2020-11-06 NOTE — Telephone Encounter (Signed)
appt needed for  More refills after this one Pleas arrange  Either cpx or yearly med check before runs out

## 2020-12-07 ENCOUNTER — Other Ambulatory Visit: Payer: Self-pay | Admitting: Internal Medicine

## 2020-12-08 ENCOUNTER — Other Ambulatory Visit: Payer: Self-pay | Admitting: Internal Medicine

## 2020-12-14 NOTE — Telephone Encounter (Signed)
I refilled it today since you are now on the schedule for follow-up appointment.

## 2021-01-12 NOTE — Progress Notes (Signed)
Chief Complaint  Patient presents with  . Annual Exam    HPI: Patient  [name]  60 y.o. comes in today for [place] visit  No major change in health  Working on decreasing wegiht  And  Less etoh Last monitor bp    4-5 months ago was  130 145.    fam hx  ?   No cva   Father may have had mi age 9 smoker   Bad habits .  ambine  Not every nights .  allergies  Off and on  Benadryl     Get  Swelling  Top of feet and around eyes under cretain cicumstances    ? If allergid has epipen for stings but no other dx   fam hx of lung and colon cancer  No insurance cause of cost  At this time   No catastrophic available so paying out of pocket   Hx of bladder cancer no sx    On a prn recheck  No sx ? No surveillance advised  X pcp  Ambien not every night  But still helps and needs at times   Health Maintenance  Topic Date Due  . COLONOSCOPY (Pts 45-42yrs Insurance coverage will need to be confirmed)  08/22/2018  . TETANUS/TDAP  08/04/2020  . COVID-19 Vaccine (2 - Booster for Janssen series) 08/08/2020  . INFLUENZA VACCINE  03/01/2021 (Originally 05/24/2020)  . Hepatitis C Screening  Completed  . HIV Screening  Completed  . HPV VACCINES  Aged Out   Health Maintenance Review LIFESTYLE:  Exercise:  Job activity  Has lost weight    Tobacco/ETS:  no Alcohol: 1-2  Many nights  Sugar beverages: Sleep: Drug use: no HH of  1` and other .  Work: Building surveyor .    ROS:  See hpi  GEN/ HEENT: No fever, significant weight changes sweats headaches vision problems hearing changes, CV/ PULM; No chest pain shortness of breath cough, syncope,edema  change in exercise tolerance. GI /GU: No adominal pain, vomiting, change in bowel habits. No blood in the stool. No significant GU symptoms. SKIN/HEME: ,no acute skin rashes suspicious lesions or bleeding. No lymphadenopathy, nodules, masses.  NEURO/ PSYCH:  No neurologic signs such as weakness numbness. No depression  anxiety. IMM/ Allergy: No unusual infections.  Allergy .   REST of 12 system review negative except as per HPI   Past Medical History:  Diagnosis Date  . Bladder cancer ([place]) 2013   RECURRENT (HX  TURBT  2013)  . Borderline hypertension   . BPH (benign prostatic hyperplasia)   . Cholelithiasis    ASYMPTOMATIC  . Diverticulosis of colon   . History of colon polyps   . Insomnia   . Left inguinal hernia   . Lower urinary tract symptoms (LUTS)   . Red skin    nodule under right under arm x 3 days no drainage   . Wears glasses     Past Surgical History:  Procedure Laterality Date  . COLONOSCOPY  2014  . CYSTOSCOPY W/ RETROGRADES Bilateral 02/19/2014   Procedure: CYSTOSCOPY WITH RETROGRADE PYELOGRAM;  Surgeon: Alexis Frock, MD;  Location[facility];  Service: Urology;  Laterality: Bilateral;  . CYSTOSCOPY W/ RETROGRADES Bilateral 06/18/2014   Procedure: CYSTOSCOPY WITH RETROGRADE PYELOGRAM;  Surgeon: Alexis Frock, MD;  Location: [facility];  Service: Urology;  Laterality: Bilateral;  . CYSTOSCOPY WITH RETROGRADE PYELOGRAM, URETEROSCOPY AND STENT PLACEMENT  10/03/2012   Procedure: CYSTOSCOPY WITH RETROGRADE PYELOGRAM, URETEROSCOPY AND STENT PLACEMENT;  Surgeon: Alexis Frock, MD;  Location: [facility];  Service: Urology;  Laterality: Left;    . HERNIA REPAIR  as child   right inguinal  . LAPAROSCOPIC CHOLECYSTECTOMY SINGLE SITE WITH INTRAOPERATIVE CHOLANGIOGRAM N/A 08/20/2015   Procedure: LAPAROSCOPIC CHOLECYSTECTOMY SINGLE SITE WITH INTRAOPERATIVE CHOLANGIOGRAM;  Surgeon: Michael Boston, MD;  Location: [facility];  Service: General;  Laterality: N/A;  . TONSILLECTOMY  as child  . TRANSURETHRAL RESECTION OF BLADDER TUMOR  10/03/2012   Procedure: TRANSURETHRAL RESECTION OF BLADDER TUMOR (TURBT);  Surgeon: Alexis Frock, MD;  Location: [facility];  Service: Urology;  Laterality: N/A;  . TRANSURETHRAL RESECTION OF BLADDER  TUMOR WITH GYRUS (TURBT-GYRUS) N/A 02/19/2014   Procedure: TRANSURETHRAL RESECTION OF BLADDER TUMOR WITH GYRUS (TURBT-GYRUS);  Surgeon: Alexis Frock, MD;  Location: [facility];  Service: Urology;  Laterality: N/A;  . TRANSURETHRAL RESECTION OF BLADDER TUMOR WITH GYRUS (TURBT-GYRUS) N/A 06/18/2014   Procedure: TRANSURETHRAL RESECTION OF BLADDER TUMOR WITH GYRUS (TURBT-GYRUS) WITH MITOMYCIN INSTILLATION;  Surgeon: Alexis Frock, MD;  Location: [facility];  Service: Urology;  Laterality: N/A;    Family History  Problem Relation Age of Onset  . Lung cancer Mother   . Colon cancer Unknown        fathers side   . ADD / ADHD Son     Social History   Socioeconomic History  . Marital status: Married    Spouse name: Not on file  . Number of children: Not on file  . Years of education: Not on file  . Highest education level: Not on file  Occupational History  . Not on file  Tobacco Use  . Smoking status: Former Smoker    Packs/day: 1.00    Years: 10.00    Pack years: 10.00    Types: Cigarettes    Quit date: 10/24/2001    Years since quitting: 19.2  . Smokeless tobacco: Never Used  Substance and Sexual Activity  . Alcohol use: No    Alcohol/week: 2.0 standard drinks    Types: 2 Standard drinks or equivalent per week  . Drug use: No  . Sexual activity: Not on file  Other Topics Concern  . 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 EPINEPHrine (EPIPEN) 0.3 mg/0.3 mL DEVI Inject 0.3 mLs (0.3 mg total) into the  muscle once. 1 Device 0  . zolpidem (AMBIEN) 10 MG tablet TAKE 1 TABLET AT BEDTIME AS NEEDED FOR SLEEP. 30 tablet 0   No facility-administered medications prior to visit.     EXAM:  BP (!) 160/108 (BP Location: Left Arm, Patient Position: Sitting, Cuff Size: Large)   Pulse 72   Temp 98 F (36.7 C) (Oral)   Ht 6\' 1"  (1.854 m)   Wt 236 lb (107 kg)   SpO2 98%   BMI 31.14 kg/m   Body mass index is 31.14  kg/m. Wt Readings from Last 3 Encounters:  01/13/21 236 lb (107 kg)  10/30/19 225 lb (102.1 kg)  08/13/18 202 lb 3.2 oz (91.7 kg)    Physical Exam: Vital signs reviewed ZOX:WRUE is a well-developed well-nourished alert cooperative    who appearsr stated age in no acute distress.  HEENT: normocephalic atraumatic , Eyes: PERRL EOM's full, conjunctiva clear, Nares: paten,t no deformity discharge or tenderness., Ears: no deformity EAC's clear TMs with normal landmarks. Mouthmasked NECK: supple without masses, thyromegaly or bruits. CHEST/PULM:  Clear to auscultation and percussion breath sounds equal no wheeze , rales or rhonchi. No chest wall deformities or tenderness. CV: PMI is nondisplaced, S1 S2 no gallops, murmurs, rubs. Peripheral pulses are full without delay.No JVD .  ABDOMEN: Bowel sounds normal nontender  No guard or rebound, no hepato splenomegal no CVA tenderness.   Extremtities:  No clubbing cyanosis or edema, no acute joint swelling or redness no focal atrophy NEURO:  Oriented x3, cranial nerves 3-12 appear to be intact, no obvious focal weakness,gait within normal limits no abnormal reflexes or asymmetrical SKIN: No acute rashes normal turgor, color, no bruising or petechiae. Top of feet  peperred brown skin symmetrical  No lesion PSYCH: Oriented, good eye contact, no obvious depression anxiety, cognition and judgment appear normal. LN: no cervical axillary inguinal adenopathy  Lab Results  Component Value Date   WBC 7.8 10/30/2019   HGB 16.2 10/30/2019   HCT 46.7  10/30/2019   PLT 272.0 10/30/2019   GLUCOSE 104 (H) 01/13/2021   CHOL 185 10/30/2019   TRIG 142.0 10/30/2019   HDL 44.40 10/30/2019   LDLDIRECT 120.3 07/21/2008   LDLCALC 112 (H) 10/30/2019   ALT 26 10/30/2019   AST 20 10/30/2019   NA 137 01/13/2021   K 4.1 01/13/2021   CL 101 01/13/2021   CREATININE 0.93 01/13/2021   BUN 21 01/13/2021   CO2 27 01/13/2021   TSH 3.80 07/01/2016   PSA 2.22 01/13/2021   HGBA1C 5.2 10/30/2019    BP Readings from Last 3 Encounters:  01/13/21 (!) 160/108  10/30/19 138/62  08/13/18 (!) 150/90    Lab plan  reviewed with patient   ASSESSMENT AND PLAN:  Discussed the following assessment and plan:    ICD-10-CM   1. Visit for preventive health examination  A54.09 Basic metabolic panel    PSA    PSA    Basic metabolic panel  2. Insomnia, unspecified type  G47.00   3. Medication management  W11.914 Basic metabolic panel    PSA    PSA    Basic metabolic panel  4. Elevated blood pressure reading  N82.9 Basic metabolic panel    PSA    PSA    Basic metabolic panel  5. Screening PSA (prostate specific antigen)  Z12.5 PSA    PSA  6. History of bladder cancer  Z85.51   7. BMI 31.0-31.9,adult  Z68.31     Hypertension   Begin monitoring   Reg   And plan add medication . And fu readings    Bmp psa lab today    Reviewed hcm need   caution cont with ambien   Agree with limited etoh and  Inc exercise   Poss allergic reactions   Disc benadryl and when possible consider seeing allergist again    Area on top of foot is symmetrica and may be from contact? Leather?  Return for 2 months send in readings  BP fu .  Patient Care Team: Panosh,  Standley Brooking, MD as PCP - General Alexis Frock, MD as Attending Physician (Urology) Patient Instructions   See allergist when  You can  Benadryl ok with epi pen if needed.  Also consider taking Claritin allegra  Or xyr tec on high risk days.   BP goal is  130/80 range   Healthy weight loss   Will alsohelp BP   Control. Limit alcohol also  Dash eating  Please bring your blood pressure cuff to next appointment Take blood pressure readings twice a day for 3-5 days and record .     Take 2 -3 readings at each sitting .   Can send in readings  by My Chart.    Before checking your blood pressure make sure: You are seated and quite for 5 min before checking Feet are flat on the floor Siting in chair with your back supported straight up and down Arm resting on table or arm of chair at heart level Bladder is empty You have NOT had caffeine or tobacco within the last 30 min  PopPath.it Begin  Medication  For bp control   Send in readings   In 2 months or as needed .   Check good rx for cost and shot for pharmacy costs of meds .   Still need colon cancer screening     Health Maintenance, Male Adopting a healthy lifestyle and getting preventive care are important in promoting health and wellness. Ask your health care provider about:  The right schedule for you to have regular tests and exams.  Things you can do on your own to prevent diseases and keep yourself healthy. What should I know about diet, weight, and exercise? Eat a healthy diet  Eat a diet that includes plenty of vegetables, fruits, low-fat dairy products, and lean protein.  Do not eat a lot of foods that are high in solid fats, added sugars, or sodium.   Maintain a healthy weight Body mass index (BMI) is a measurement that can be used to identify possible weight problems. It estimates body fat based on height and weight. Your health care provider can help determine your BMI and help you achieve or maintain a healthy weight. Get regular exercise Get regular exercise. This is one of the most important things you can do for your health. Most adults should:  Exercise for at least 150 minutes each week. The exercise should increase your heart rate and make you sweat (moderate-intensity exercise).  Do strengthening exercises at  least twice a week. This is in addition to the moderate-intensity exercise.  Spend less time sitting. Even light physical activity can be beneficial. Watch cholesterol and blood lipids Have your blood tested for lipids and cholesterol at 61 years of age, then have this test every 5 years. You may need to have your cholesterol levels checked more often if:  Your lipid or cholesterol levels are high.  You are older than 61 years of age.  You are at high risk for heart disease. What should I know about cancer screening? Many types of cancers can be detected early and may often be prevented. Depending on your health history and family history, you may need to have cancer screening at various ages. This may include screening for:  Colorectal cancer.  Prostate cancer.  Skin cancer.  Lung cancer. What should I know about heart disease, diabetes, and high blood pressure? Blood pressure and heart disease  High blood pressure causes heart disease and increases the risk of stroke.  This is more likely to develop in people who have high blood pressure readings, are of African descent, or are overweight.  Talk with your health care provider about your target blood pressure readings.  Have your blood pressure checked: ? Every 3-5 years if you are 18-32 years of age. ? Every year if you are 77 years old or older.  If you are between the ages of 10 and 46 and are a current or former smoker, ask your health care provider if you should have a one-time screening for abdominal aortic aneurysm (AAA). Diabetes Have regular diabetes screenings. This checks your fasting blood sugar level. Have the screening done:  Once every three years after age 3 if you are at a normal weight and have a low risk for diabetes.  More often and at a younger age if you are overweight or have a high risk for diabetes. What should I know about preventing infection? Hepatitis B If you have a higher risk for hepatitis B,  you should be screened for this virus. Talk with your health care provider to find out if you are at risk for hepatitis B infection. Hepatitis C Blood testing is recommended for:  Everyone born from [birth year] through 1966.  Anyone with known risk factors for hepatitis C. Sexually transmitted infections (STIs)  You should be screened each year for STIs, including gonorrhea and chlamydia, if: ? You are sexually active and are younger than 61 years of age. ? You are older than 61 years of age and your health care provider tells you that you are at risk for this type of infection. ? Your sexual activity has changed since you were last screened, and you are at increased risk for chlamydia or gonorrhea. Ask your health care provider if you are at risk.  Ask your health care provider about whether you are at high risk for HIV. Your health care provider may recommend a prescription medicine to help prevent HIV infection. If you choose to take medicine to prevent HIV, you should first get tested for HIV. You should then be tested every 3 months for as long as you are taking the medicine. Follow these instructions at home: Lifestyle  Do not use any products that contain nicotine or tobacco, such as cigarettes, e-cigarettes, and chewing tobacco. If you need help quitting, ask your health care provider.  Do not use street drugs.  Do not share needles.  Ask your health care provider for help if you need support or information about quitting drugs. Alcohol use  Do not drink alcohol if your health care provider tells you not to drink.  If you drink alcohol: ? Limit how much you have to 0-2 drinks a day. ? Be aware of how much alcohol is in your drink. In the [place] one drink equals one 12 oz bottle of beer (355 mL), one 5 oz glass of wine (148 mL), or one 1 oz glass of hard liquor (44 mL). General instructions  Schedule regular health, dental, and eye exams.  Stay current with your vaccines.  Tell  your health care provider if: ? You often feel depressed. ? You have ever been abused or do not feel safe at home. Summary  Adopting a healthy lifestyle and getting preventive care are important in promoting health and wellness.  Follow your health care provider's instructions about healthy diet, exercising, and getting tested or screened for diseases.  Follow your health care provider's instructions on monitoring your cholesterol and blood pressure.  This information is not intended to replace advice given to you by your health care provider. Make sure you discuss any questions you have with your health care provider. Document Revised: 10/03/2018 Document Reviewed: 10/03/2018 Elsevier Patient Education  2021 [place] K. Panosh M.D.

## 2021-01-13 ENCOUNTER — Encounter: Payer: Self-pay | Admitting: Internal Medicine

## 2021-01-13 ENCOUNTER — Ambulatory Visit (INDEPENDENT_AMBULATORY_CARE_PROVIDER_SITE_OTHER): Payer: Self-pay | Admitting: Internal Medicine

## 2021-01-13 ENCOUNTER — Other Ambulatory Visit: Payer: Self-pay

## 2021-01-13 VITALS — BP 160/108 | HR 72 | Temp 98.0°F | Ht 73.0 in | Wt 236.0 lb

## 2021-01-13 DIAGNOSIS — Z125 Encounter for screening for malignant neoplasm of prostate: Secondary | ICD-10-CM

## 2021-01-13 DIAGNOSIS — R030 Elevated blood-pressure reading, without diagnosis of hypertension: Secondary | ICD-10-CM

## 2021-01-13 DIAGNOSIS — Z0000 Encounter for general adult medical examination without abnormal findings: Secondary | ICD-10-CM

## 2021-01-13 DIAGNOSIS — Z6831 Body mass index (BMI) 31.0-31.9, adult: Secondary | ICD-10-CM

## 2021-01-13 DIAGNOSIS — E786 Lipoprotein deficiency: Secondary | ICD-10-CM

## 2021-01-13 DIAGNOSIS — G4700 Insomnia, unspecified: Secondary | ICD-10-CM

## 2021-01-13 DIAGNOSIS — Z79899 Other long term (current) drug therapy: Secondary | ICD-10-CM

## 2021-01-13 DIAGNOSIS — Z8551 Personal history of malignant neoplasm of bladder: Secondary | ICD-10-CM

## 2021-01-13 LAB — BASIC METABOLIC PANEL
BUN: 21 mg/dL (ref 6–23)
CO2: 27 mEq/L (ref 19–32)
Calcium: 9.5 mg/dL (ref 8.4–10.5)
Chloride: 101 mEq/L (ref 96–112)
Creatinine, Ser: 0.93 mg/dL (ref 0.40–1.50)
GFR: 89.27 mL/min (ref >60.00)
Glucose, Bld: 104 mg/dL — ABNORMAL HIGH (ref 70–99)
Potassium: 4.1 mEq/L (ref 3.5–5.1)
Sodium: 137 mEq/L (ref 135–145)

## 2021-01-13 LAB — PSA: PSA: 2.22 ng/mL (ref 0.10–4.00)

## 2021-01-13 MED ORDER — AMLODIPINE BESYLATE 5 MG PO TABS: 5.0000 mg | ORAL_TABLET | Freq: Every day | ORAL | 5 refills | Status: AC

## 2021-01-13 NOTE — Patient Instructions (Addendum)
See allergist when  You can  Benadryl ok with epi pen if needed.  Also consider taking Claritin allegra  Or xyr tec on high risk days.   BP goal is  130/80 range   Healthy weight loss   Will alsohelp BP  Control. Limit alcohol also  Dash eating  Please bring your blood pressure cuff to next appointment Take blood pressure readings twice a day for 3-5 days and record .     Take 2 -3 readings at each sitting .   Can send in readings  by My Chart.    Before checking your blood pressure make sure: You are seated and quite for 5 min before checking Feet are flat on the floor Siting in chair with your back supported straight up and down Arm resting on table or arm of chair at heart level Bladder is empty You have NOT had caffeine or tobacco within the last 30 min  PopPath.it Begin  Medication  For bp control   Send in readings   In 2 months or as needed .   Check good rx for cost and shot for pharmacy costs of meds .   Still need colon cancer screening     Health Maintenance, Male Adopting a healthy lifestyle and getting preventive care are important in promoting health and wellness. Ask your health care provider about:  The right schedule for you to have regular tests and exams.  Things you can do on your own to prevent diseases and keep yourself healthy. What should I know about diet, weight, and exercise? Eat a healthy diet  Eat a diet that includes plenty of vegetables, fruits, low-fat dairy products, and lean protein.  Do not eat a lot of foods that are high in solid fats, added sugars, or sodium.   Maintain a healthy weight Body mass index (BMI) is a measurement that can be used to identify possible weight problems. It estimates body fat based on height and weight. Your health care provider can help determine your BMI and help you achieve or maintain a healthy weight. Get regular exercise Get regular exercise. This is one of the most important things you can do for  your health. Most adults should:  Exercise for at least 150 minutes each week. The exercise should increase your heart rate and make you sweat (moderate-intensity exercise).  Do strengthening exercises at least twice a week. This is in addition to the moderate-intensity exercise.  Spend less time sitting. Even light physical activity can be beneficial. Watch cholesterol and blood lipids Have your blood tested for lipids and cholesterol at 61 years of age, then have this test every 5 years. You may need to have your cholesterol levels checked more often if:  Your lipid or cholesterol levels are high.  You are older than 61 years of age.  You are at high risk for heart disease. What should I know about cancer screening? Many types of cancers can be detected early and may often be prevented. Depending on your health history and family history, you may need to have cancer screening at various ages. This may include screening for:  Colorectal cancer.  Prostate cancer.  Skin cancer.  Lung cancer. What should I know about heart disease, diabetes, and high blood pressure? Blood pressure and heart disease  High blood pressure causes heart disease and increases the risk of stroke. This is more likely to develop in people who have high blood pressure readings, are of African descent, or  are overweight.  Talk with your health care provider about your target blood pressure readings.  Have your blood pressure checked: ? Every 3-5 years if you are 61-7 years of age. ? Every year if you are 28 years old or older.  If you are between the ages of 76 and 24 and are a current or former smoker, ask your health care provider if you should have a one-time screening for abdominal aortic aneurysm (AAA). Diabetes Have regular diabetes screenings. This checks your fasting blood sugar level. Have the screening done:  Once every three years after age 52 if you are at a normal weight and have a low risk  for diabetes.  More often and at a younger age if you are overweight or have a high risk for diabetes. What should I know about preventing infection? Hepatitis B If you have a higher risk for hepatitis B, you should be screened for this virus. Talk with your health care provider to find out if you are at risk for hepatitis B infection. Hepatitis C Blood testing is recommended for:  Everyone born from [birth year] through 1966.  Anyone with known risk factors for hepatitis C. Sexually transmitted infections (STIs)  You should be screened each year for STIs, including gonorrhea and chlamydia, if: ? You are sexually active and are younger than 61 years of age. ? You are older than 61 years of age and your health care provider tells you that you are at risk for this type of infection. ? Your sexual activity has changed since you were last screened, and you are at increased risk for chlamydia or gonorrhea. Ask your health care provider if you are at risk.  Ask your health care provider about whether you are at high risk for HIV. Your health care provider may recommend a prescription medicine to help prevent HIV infection. If you choose to take medicine to prevent HIV, you should first get tested for HIV. You should then be tested every 3 months for as long as you are taking the medicine. Follow these instructions at home: Lifestyle  Do not use any products that contain nicotine or tobacco, such as cigarettes, e-cigarettes, and chewing tobacco. If you need help quitting, ask your health care provider.  Do not use street drugs.  Do not share needles.  Ask your health care provider for help if you need support or information about quitting drugs. Alcohol use  Do not drink alcohol if your health care provider tells you not to drink.  If you drink alcohol: ? Limit how much you have to 0-2 drinks a day. ? Be aware of how much alcohol is in your drink. In the [place] one drink equals one 12 oz bottle  of beer (355 mL), one 5 oz glass of wine (148 mL), or one 1 oz glass of hard liquor (44 mL). General instructions  Schedule regular health, dental, and eye exams.  Stay current with your vaccines.  Tell your health care provider if: ? You often feel depressed. ? You have ever been abused or do not feel safe at home. Summary  Adopting a healthy lifestyle and getting preventive care are important in promoting health and wellness.  Follow your health care provider's instructions about healthy diet, exercising, and getting tested or screened for diseases.  Follow your health care provider's instructions on monitoring your cholesterol and blood pressure. This information is not intended to replace advice given to you by your health care provider. Make sure you  discuss any questions you have with your health care provider. Document Revised: 10/03/2018 Document Reviewed: 10/03/2018 Elsevier Patient Education  2021 [employer].

## 2021-01-14 NOTE — Progress Notes (Signed)
Borderline elevated blood sugar   but not diabetes ,  psa stable  . Continue lifestyle intervention healthy eating and exercise as planned  .  And bp info follow up.

## 2021-01-15 ENCOUNTER — Other Ambulatory Visit: Payer: Self-pay | Admitting: Internal Medicine

## 2021-01-15 NOTE — Telephone Encounter (Signed)
Last OV: 01/13/2021 Last Refill: 12/14/2020 Disp: 30 R:0  Future OV: None scheduled

## 2021-02-19 ENCOUNTER — Other Ambulatory Visit: Payer: Self-pay | Admitting: Internal Medicine

## 2021-03-25 ENCOUNTER — Other Ambulatory Visit: Payer: Self-pay | Admitting: Internal Medicine

## 2021-03-25 NOTE — Telephone Encounter (Signed)
Last OV: 01/13/2021 Last Refill: 02/22/2021  Disp: 30  R: 0  Future OV: None scheduled

## 2021-03-31 NOTE — Telephone Encounter (Signed)
Dr. Regis Bill patient. Please advise.  Thanks

## 2021-03-31 NOTE — Telephone Encounter (Signed)
Abigail Butts is calling back from [facility] to check the status of medication refill request for patient. CB is [phone]

## 2021-05-06 ENCOUNTER — Other Ambulatory Visit: Payer: Self-pay | Admitting: Family Medicine

## 2021-06-18 ENCOUNTER — Other Ambulatory Visit: Payer: Self-pay | Admitting: Internal Medicine

## 2021-07-30 ENCOUNTER — Other Ambulatory Visit: Payer: Self-pay | Admitting: Internal Medicine

## 2021-10-13 ENCOUNTER — Other Ambulatory Visit: Payer: Self-pay | Admitting: Family Medicine

## 2021-10-13 NOTE — Telephone Encounter (Signed)
Please have him arrange a future office visit need follow-up around his blood pressure We will refill the Ambien today

## 2021-10-13 NOTE — Telephone Encounter (Signed)
Last filled 08/02/2021 Last OV 01/13/2021  Ok to fill?

## 2023-10-30 ENCOUNTER — Telehealth: Payer: Self-pay | Admitting: Internal Medicine

## 2023-10-30 NOTE — Telephone Encounter (Signed)
 Called pt to sch annual physical, left vm. Pls sch pt for physical appt.
